# Patient Record
Sex: Male | Born: 1965
Health system: Southern US, Community
[De-identification: ages and names within clinical notes are randomized; demographics above are authoritative.]

## PROBLEM LIST (undated history)

## (undated) DIAGNOSIS — F39 Unspecified mood [affective] disorder: Secondary | ICD-10-CM

## (undated) DIAGNOSIS — Z9889 Other specified postprocedural states: Secondary | ICD-10-CM

## (undated) DIAGNOSIS — K219 Gastro-esophageal reflux disease without esophagitis: Secondary | ICD-10-CM

## (undated) DIAGNOSIS — I739 Peripheral vascular disease, unspecified: Secondary | ICD-10-CM

## (undated) DIAGNOSIS — E0590 Thyrotoxicosis, unspecified without thyrotoxic crisis or storm: Secondary | ICD-10-CM

## (undated) DIAGNOSIS — K922 Gastrointestinal hemorrhage, unspecified: Secondary | ICD-10-CM

## (undated) DIAGNOSIS — N529 Male erectile dysfunction, unspecified: Secondary | ICD-10-CM

## (undated) DIAGNOSIS — I700 Atherosclerosis of aorta: Secondary | ICD-10-CM

## (undated) DIAGNOSIS — I10 Essential (primary) hypertension: Secondary | ICD-10-CM

## (undated) DIAGNOSIS — Z8673 Personal history of transient ischemic attack (TIA), and cerebral infarction without residual deficits: Secondary | ICD-10-CM

## (undated) DIAGNOSIS — Z8719 Personal history of other diseases of the digestive system: Secondary | ICD-10-CM

## (undated) DIAGNOSIS — K5090 Crohn's disease, unspecified, without complications: Secondary | ICD-10-CM

## (undated) DIAGNOSIS — E559 Vitamin D deficiency, unspecified: Secondary | ICD-10-CM

## (undated) DIAGNOSIS — J449 Chronic obstructive pulmonary disease, unspecified: Secondary | ICD-10-CM

## (undated) HISTORY — DX: Peripheral vascular disease, unspecified: I73.9

## (undated) HISTORY — DX: Personal history of other diseases of the digestive system: Z87.19

## (undated) HISTORY — PX: WISDOM TOOTH EXTRACTION: SHX21

## (undated) HISTORY — DX: Crohn's disease, unspecified, without complications: K50.90

## (undated) HISTORY — DX: Other specified postprocedural states: Z98.890

## (undated) HISTORY — DX: Gastrointestinal hemorrhage, unspecified: K92.2

## (undated) HISTORY — PX: HERNIA REPAIR: SHX51

## (undated) HISTORY — DX: Vitamin D deficiency, unspecified: E55.9

## (undated) HISTORY — DX: Atherosclerosis of aorta: I70.0

## (undated) HISTORY — DX: Unspecified mood (affective) disorder: F39

## (undated) HISTORY — DX: Male erectile dysfunction, unspecified: N52.9

## (undated) HISTORY — PX: COLOSTOMY REVERSAL: SHX5782

## (undated) HISTORY — DX: Chronic obstructive pulmonary disease, unspecified: J44.9

## (undated) HISTORY — PX: OTHER SURGICAL HISTORY: SHX169

## (undated) HISTORY — PX: COLECTOMY: SHX59

## (undated) HISTORY — DX: Personal history of transient ischemic attack (TIA), and cerebral infarction without residual deficits: Z86.73

## (undated) HISTORY — DX: Gastro-esophageal reflux disease without esophagitis: K21.9

## (undated) HISTORY — DX: Essential (primary) hypertension: I10

## (undated) HISTORY — PX: APPENDECTOMY: SHX54

## (undated) HISTORY — DX: Thyrotoxicosis, unspecified without thyrotoxic crisis or storm: E05.90

---

## 2009-08-02 HISTORY — PX: UPPER GI ENDOSCOPY: SHX6162

## 2015-09-05 DIAGNOSIS — G459 Transient cerebral ischemic attack, unspecified: Secondary | ICD-10-CM | POA: Insufficient documentation

## 2015-09-05 DIAGNOSIS — E0590 Thyrotoxicosis, unspecified without thyrotoxic crisis or storm: Secondary | ICD-10-CM | POA: Insufficient documentation

## 2015-09-05 HISTORY — DX: Transient cerebral ischemic attack, unspecified: G45.9

## 2015-11-10 DIAGNOSIS — Z1389 Encounter for screening for other disorder: Secondary | ICD-10-CM | POA: Diagnosis not present

## 2015-11-10 DIAGNOSIS — E782 Mixed hyperlipidemia: Secondary | ICD-10-CM | POA: Diagnosis not present

## 2015-11-10 DIAGNOSIS — I10 Essential (primary) hypertension: Secondary | ICD-10-CM | POA: Diagnosis not present

## 2015-11-10 DIAGNOSIS — E559 Vitamin D deficiency, unspecified: Secondary | ICD-10-CM | POA: Diagnosis not present

## 2015-11-10 DIAGNOSIS — Z79899 Other long term (current) drug therapy: Secondary | ICD-10-CM | POA: Diagnosis not present

## 2015-11-10 DIAGNOSIS — K219 Gastro-esophageal reflux disease without esophagitis: Secondary | ICD-10-CM | POA: Diagnosis not present

## 2015-12-04 DIAGNOSIS — E0500 Thyrotoxicosis with diffuse goiter without thyrotoxic crisis or storm: Secondary | ICD-10-CM | POA: Diagnosis not present

## 2015-12-04 DIAGNOSIS — Z79899 Other long term (current) drug therapy: Secondary | ICD-10-CM | POA: Diagnosis not present

## 2015-12-23 DIAGNOSIS — Z01818 Encounter for other preprocedural examination: Secondary | ICD-10-CM | POA: Diagnosis not present

## 2015-12-23 DIAGNOSIS — Z1211 Encounter for screening for malignant neoplasm of colon: Secondary | ICD-10-CM | POA: Diagnosis not present

## 2015-12-23 DIAGNOSIS — K50911 Crohn's disease, unspecified, with rectal bleeding: Secondary | ICD-10-CM | POA: Diagnosis not present

## 2016-01-08 DIAGNOSIS — F17200 Nicotine dependence, unspecified, uncomplicated: Secondary | ICD-10-CM | POA: Diagnosis not present

## 2016-01-08 DIAGNOSIS — I10 Essential (primary) hypertension: Secondary | ICD-10-CM | POA: Diagnosis not present

## 2016-01-08 DIAGNOSIS — T7840XA Allergy, unspecified, initial encounter: Secondary | ICD-10-CM | POA: Diagnosis not present

## 2016-01-08 DIAGNOSIS — E7800 Pure hypercholesterolemia, unspecified: Secondary | ICD-10-CM | POA: Diagnosis not present

## 2016-01-12 DIAGNOSIS — K5090 Crohn's disease, unspecified, without complications: Secondary | ICD-10-CM | POA: Diagnosis not present

## 2016-01-13 DIAGNOSIS — K5090 Crohn's disease, unspecified, without complications: Secondary | ICD-10-CM | POA: Diagnosis not present

## 2016-01-31 HISTORY — PX: COLONOSCOPY: SHX174

## 2016-02-09 DIAGNOSIS — K219 Gastro-esophageal reflux disease without esophagitis: Secondary | ICD-10-CM | POA: Diagnosis not present

## 2016-02-09 DIAGNOSIS — K635 Polyp of colon: Secondary | ICD-10-CM | POA: Diagnosis not present

## 2016-02-09 DIAGNOSIS — Z1211 Encounter for screening for malignant neoplasm of colon: Secondary | ICD-10-CM | POA: Diagnosis not present

## 2016-02-09 DIAGNOSIS — I10 Essential (primary) hypertension: Secondary | ICD-10-CM | POA: Diagnosis not present

## 2016-02-09 DIAGNOSIS — F17200 Nicotine dependence, unspecified, uncomplicated: Secondary | ICD-10-CM | POA: Diagnosis not present

## 2016-02-09 DIAGNOSIS — E039 Hypothyroidism, unspecified: Secondary | ICD-10-CM | POA: Diagnosis not present

## 2016-02-09 DIAGNOSIS — K50911 Crohn's disease, unspecified, with rectal bleeding: Secondary | ICD-10-CM | POA: Diagnosis not present

## 2016-02-09 DIAGNOSIS — Z8673 Personal history of transient ischemic attack (TIA), and cerebral infarction without residual deficits: Secondary | ICD-10-CM | POA: Diagnosis not present

## 2016-02-09 DIAGNOSIS — M1990 Unspecified osteoarthritis, unspecified site: Secondary | ICD-10-CM | POA: Diagnosis not present

## 2016-02-09 DIAGNOSIS — E119 Type 2 diabetes mellitus without complications: Secondary | ICD-10-CM | POA: Diagnosis not present

## 2016-02-09 DIAGNOSIS — K5730 Diverticulosis of large intestine without perforation or abscess without bleeding: Secondary | ICD-10-CM | POA: Diagnosis not present

## 2016-02-09 DIAGNOSIS — J449 Chronic obstructive pulmonary disease, unspecified: Secondary | ICD-10-CM | POA: Diagnosis not present

## 2016-02-09 DIAGNOSIS — K5090 Crohn's disease, unspecified, without complications: Secondary | ICD-10-CM | POA: Diagnosis not present

## 2016-02-09 DIAGNOSIS — D124 Benign neoplasm of descending colon: Secondary | ICD-10-CM | POA: Diagnosis not present

## 2016-02-09 DIAGNOSIS — K648 Other hemorrhoids: Secondary | ICD-10-CM | POA: Diagnosis not present

## 2016-03-03 ENCOUNTER — Encounter: Payer: Self-pay | Admitting: Gastroenterology

## 2016-05-04 DIAGNOSIS — Z6831 Body mass index (BMI) 31.0-31.9, adult: Secondary | ICD-10-CM | POA: Diagnosis not present

## 2016-05-04 DIAGNOSIS — Z0000 Encounter for general adult medical examination without abnormal findings: Secondary | ICD-10-CM | POA: Diagnosis not present

## 2016-05-04 DIAGNOSIS — E669 Obesity, unspecified: Secondary | ICD-10-CM | POA: Diagnosis not present

## 2016-05-04 DIAGNOSIS — I10 Essential (primary) hypertension: Secondary | ICD-10-CM | POA: Diagnosis not present

## 2016-06-09 DIAGNOSIS — E0500 Thyrotoxicosis with diffuse goiter without thyrotoxic crisis or storm: Secondary | ICD-10-CM | POA: Diagnosis not present

## 2016-06-09 DIAGNOSIS — Z79899 Other long term (current) drug therapy: Secondary | ICD-10-CM | POA: Diagnosis not present

## 2016-07-05 DIAGNOSIS — F329 Major depressive disorder, single episode, unspecified: Secondary | ICD-10-CM | POA: Diagnosis not present

## 2016-07-05 DIAGNOSIS — M7989 Other specified soft tissue disorders: Secondary | ICD-10-CM | POA: Diagnosis not present

## 2016-07-05 DIAGNOSIS — E079 Disorder of thyroid, unspecified: Secondary | ICD-10-CM | POA: Diagnosis not present

## 2016-07-05 DIAGNOSIS — R609 Edema, unspecified: Secondary | ICD-10-CM | POA: Diagnosis not present

## 2016-07-05 DIAGNOSIS — K219 Gastro-esophageal reflux disease without esophagitis: Secondary | ICD-10-CM | POA: Diagnosis not present

## 2016-07-05 DIAGNOSIS — F419 Anxiety disorder, unspecified: Secondary | ICD-10-CM | POA: Diagnosis not present

## 2016-07-05 DIAGNOSIS — M25561 Pain in right knee: Secondary | ICD-10-CM | POA: Diagnosis not present

## 2016-07-05 DIAGNOSIS — F17210 Nicotine dependence, cigarettes, uncomplicated: Secondary | ICD-10-CM | POA: Diagnosis not present

## 2016-07-05 DIAGNOSIS — E785 Hyperlipidemia, unspecified: Secondary | ICD-10-CM | POA: Diagnosis not present

## 2016-07-05 DIAGNOSIS — M1711 Unilateral primary osteoarthritis, right knee: Secondary | ICD-10-CM | POA: Diagnosis not present

## 2016-07-05 DIAGNOSIS — I10 Essential (primary) hypertension: Secondary | ICD-10-CM | POA: Diagnosis not present

## 2016-07-05 DIAGNOSIS — L03115 Cellulitis of right lower limb: Secondary | ICD-10-CM | POA: Diagnosis not present

## 2016-07-12 DIAGNOSIS — L03115 Cellulitis of right lower limb: Secondary | ICD-10-CM | POA: Insufficient documentation

## 2016-07-12 DIAGNOSIS — Z6832 Body mass index (BMI) 32.0-32.9, adult: Secondary | ICD-10-CM | POA: Diagnosis not present

## 2016-07-12 HISTORY — DX: Cellulitis of right lower limb: L03.115

## 2016-07-13 DIAGNOSIS — L03115 Cellulitis of right lower limb: Secondary | ICD-10-CM | POA: Diagnosis not present

## 2016-07-14 DIAGNOSIS — L03115 Cellulitis of right lower limb: Secondary | ICD-10-CM | POA: Diagnosis not present

## 2016-07-15 DIAGNOSIS — L03115 Cellulitis of right lower limb: Secondary | ICD-10-CM | POA: Diagnosis not present

## 2016-07-15 DIAGNOSIS — M7041 Prepatellar bursitis, right knee: Secondary | ICD-10-CM | POA: Diagnosis not present

## 2016-07-16 DIAGNOSIS — L03115 Cellulitis of right lower limb: Secondary | ICD-10-CM | POA: Diagnosis not present

## 2016-07-22 DIAGNOSIS — M7041 Prepatellar bursitis, right knee: Secondary | ICD-10-CM | POA: Diagnosis not present

## 2016-08-10 DIAGNOSIS — M7041 Prepatellar bursitis, right knee: Secondary | ICD-10-CM | POA: Diagnosis not present

## 2016-08-23 DIAGNOSIS — Z6833 Body mass index (BMI) 33.0-33.9, adult: Secondary | ICD-10-CM | POA: Diagnosis not present

## 2016-08-23 DIAGNOSIS — M545 Low back pain: Secondary | ICD-10-CM | POA: Diagnosis not present

## 2016-08-23 DIAGNOSIS — L0291 Cutaneous abscess, unspecified: Secondary | ICD-10-CM | POA: Diagnosis not present

## 2016-08-23 DIAGNOSIS — E669 Obesity, unspecified: Secondary | ICD-10-CM | POA: Diagnosis not present

## 2016-08-25 DIAGNOSIS — M7041 Prepatellar bursitis, right knee: Secondary | ICD-10-CM | POA: Diagnosis not present

## 2016-09-08 DIAGNOSIS — M7041 Prepatellar bursitis, right knee: Secondary | ICD-10-CM | POA: Diagnosis not present

## 2016-09-09 DIAGNOSIS — I10 Essential (primary) hypertension: Secondary | ICD-10-CM | POA: Diagnosis not present

## 2016-09-09 DIAGNOSIS — E0590 Thyrotoxicosis, unspecified without thyrotoxic crisis or storm: Secondary | ICD-10-CM | POA: Diagnosis not present

## 2016-09-09 DIAGNOSIS — R600 Localized edema: Secondary | ICD-10-CM | POA: Diagnosis not present

## 2016-09-09 DIAGNOSIS — E782 Mixed hyperlipidemia: Secondary | ICD-10-CM | POA: Diagnosis not present

## 2016-09-17 DIAGNOSIS — Z79899 Other long term (current) drug therapy: Secondary | ICD-10-CM | POA: Diagnosis not present

## 2016-09-17 DIAGNOSIS — K219 Gastro-esophageal reflux disease without esophagitis: Secondary | ICD-10-CM | POA: Diagnosis not present

## 2016-09-17 DIAGNOSIS — M7041 Prepatellar bursitis, right knee: Secondary | ICD-10-CM | POA: Diagnosis not present

## 2016-09-17 DIAGNOSIS — I10 Essential (primary) hypertension: Secondary | ICD-10-CM | POA: Diagnosis not present

## 2016-09-17 DIAGNOSIS — E785 Hyperlipidemia, unspecified: Secondary | ICD-10-CM | POA: Diagnosis not present

## 2016-09-17 DIAGNOSIS — E039 Hypothyroidism, unspecified: Secondary | ICD-10-CM | POA: Diagnosis not present

## 2016-09-17 DIAGNOSIS — Z7982 Long term (current) use of aspirin: Secondary | ICD-10-CM | POA: Diagnosis not present

## 2016-09-17 DIAGNOSIS — I252 Old myocardial infarction: Secondary | ICD-10-CM | POA: Diagnosis not present

## 2016-09-17 DIAGNOSIS — F17210 Nicotine dependence, cigarettes, uncomplicated: Secondary | ICD-10-CM | POA: Diagnosis not present

## 2016-10-04 DIAGNOSIS — M6281 Muscle weakness (generalized): Secondary | ICD-10-CM | POA: Diagnosis not present

## 2016-10-04 DIAGNOSIS — M25461 Effusion, right knee: Secondary | ICD-10-CM | POA: Diagnosis not present

## 2016-10-04 DIAGNOSIS — R2689 Other abnormalities of gait and mobility: Secondary | ICD-10-CM | POA: Diagnosis not present

## 2016-10-04 DIAGNOSIS — M25561 Pain in right knee: Secondary | ICD-10-CM | POA: Diagnosis not present

## 2016-10-07 DIAGNOSIS — M25461 Effusion, right knee: Secondary | ICD-10-CM | POA: Diagnosis not present

## 2016-10-07 DIAGNOSIS — M6281 Muscle weakness (generalized): Secondary | ICD-10-CM | POA: Diagnosis not present

## 2016-10-07 DIAGNOSIS — M25561 Pain in right knee: Secondary | ICD-10-CM | POA: Diagnosis not present

## 2016-10-07 DIAGNOSIS — R2689 Other abnormalities of gait and mobility: Secondary | ICD-10-CM | POA: Diagnosis not present

## 2016-10-12 DIAGNOSIS — M25561 Pain in right knee: Secondary | ICD-10-CM | POA: Diagnosis not present

## 2016-10-12 DIAGNOSIS — M25461 Effusion, right knee: Secondary | ICD-10-CM | POA: Diagnosis not present

## 2016-10-12 DIAGNOSIS — R2689 Other abnormalities of gait and mobility: Secondary | ICD-10-CM | POA: Diagnosis not present

## 2016-10-12 DIAGNOSIS — M6281 Muscle weakness (generalized): Secondary | ICD-10-CM | POA: Diagnosis not present

## 2016-10-14 DIAGNOSIS — M6281 Muscle weakness (generalized): Secondary | ICD-10-CM | POA: Diagnosis not present

## 2016-10-14 DIAGNOSIS — M25461 Effusion, right knee: Secondary | ICD-10-CM | POA: Diagnosis not present

## 2016-10-14 DIAGNOSIS — M25561 Pain in right knee: Secondary | ICD-10-CM | POA: Diagnosis not present

## 2016-10-14 DIAGNOSIS — R2689 Other abnormalities of gait and mobility: Secondary | ICD-10-CM | POA: Diagnosis not present

## 2016-10-19 DIAGNOSIS — M6281 Muscle weakness (generalized): Secondary | ICD-10-CM | POA: Diagnosis not present

## 2016-10-19 DIAGNOSIS — R2689 Other abnormalities of gait and mobility: Secondary | ICD-10-CM | POA: Diagnosis not present

## 2016-10-19 DIAGNOSIS — M25461 Effusion, right knee: Secondary | ICD-10-CM | POA: Diagnosis not present

## 2016-10-19 DIAGNOSIS — M25561 Pain in right knee: Secondary | ICD-10-CM | POA: Diagnosis not present

## 2016-10-21 DIAGNOSIS — R2689 Other abnormalities of gait and mobility: Secondary | ICD-10-CM | POA: Diagnosis not present

## 2016-10-21 DIAGNOSIS — M25461 Effusion, right knee: Secondary | ICD-10-CM | POA: Diagnosis not present

## 2016-10-21 DIAGNOSIS — M6281 Muscle weakness (generalized): Secondary | ICD-10-CM | POA: Diagnosis not present

## 2016-10-21 DIAGNOSIS — M25561 Pain in right knee: Secondary | ICD-10-CM | POA: Diagnosis not present

## 2016-10-25 DIAGNOSIS — M7041 Prepatellar bursitis, right knee: Secondary | ICD-10-CM | POA: Diagnosis not present

## 2016-12-02 DIAGNOSIS — R1012 Left upper quadrant pain: Secondary | ICD-10-CM | POA: Diagnosis not present

## 2016-12-02 DIAGNOSIS — Z6833 Body mass index (BMI) 33.0-33.9, adult: Secondary | ICD-10-CM | POA: Diagnosis not present

## 2016-12-02 DIAGNOSIS — E669 Obesity, unspecified: Secondary | ICD-10-CM | POA: Diagnosis not present

## 2016-12-03 DIAGNOSIS — R1012 Left upper quadrant pain: Secondary | ICD-10-CM | POA: Diagnosis not present

## 2016-12-03 DIAGNOSIS — I700 Atherosclerosis of aorta: Secondary | ICD-10-CM | POA: Diagnosis not present

## 2016-12-03 DIAGNOSIS — R109 Unspecified abdominal pain: Secondary | ICD-10-CM | POA: Diagnosis not present

## 2016-12-07 DIAGNOSIS — Z79899 Other long term (current) drug therapy: Secondary | ICD-10-CM | POA: Diagnosis not present

## 2016-12-07 DIAGNOSIS — E0500 Thyrotoxicosis with diffuse goiter without thyrotoxic crisis or storm: Secondary | ICD-10-CM | POA: Diagnosis not present

## 2016-12-09 DIAGNOSIS — Z9889 Other specified postprocedural states: Secondary | ICD-10-CM | POA: Diagnosis not present

## 2016-12-09 DIAGNOSIS — Z8719 Personal history of other diseases of the digestive system: Secondary | ICD-10-CM | POA: Diagnosis not present

## 2016-12-16 DIAGNOSIS — Z8719 Personal history of other diseases of the digestive system: Secondary | ICD-10-CM

## 2016-12-16 DIAGNOSIS — Z9889 Other specified postprocedural states: Secondary | ICD-10-CM | POA: Diagnosis not present

## 2016-12-16 HISTORY — DX: Personal history of other diseases of the digestive system: Z87.19

## 2016-12-24 DIAGNOSIS — Z9119 Patient's noncompliance with other medical treatment and regimen: Secondary | ICD-10-CM | POA: Diagnosis not present

## 2016-12-24 DIAGNOSIS — I10 Essential (primary) hypertension: Secondary | ICD-10-CM

## 2016-12-24 DIAGNOSIS — K660 Peritoneal adhesions (postprocedural) (postinfection): Secondary | ICD-10-CM | POA: Diagnosis not present

## 2016-12-24 DIAGNOSIS — F17210 Nicotine dependence, cigarettes, uncomplicated: Secondary | ICD-10-CM | POA: Diagnosis not present

## 2016-12-24 DIAGNOSIS — Z7982 Long term (current) use of aspirin: Secondary | ICD-10-CM | POA: Diagnosis not present

## 2016-12-24 DIAGNOSIS — Z8673 Personal history of transient ischemic attack (TIA), and cerebral infarction without residual deficits: Secondary | ICD-10-CM | POA: Diagnosis not present

## 2016-12-24 DIAGNOSIS — E785 Hyperlipidemia, unspecified: Secondary | ICD-10-CM | POA: Insufficient documentation

## 2016-12-24 DIAGNOSIS — K432 Incisional hernia without obstruction or gangrene: Secondary | ICD-10-CM | POA: Diagnosis not present

## 2016-12-24 DIAGNOSIS — G4733 Obstructive sleep apnea (adult) (pediatric): Secondary | ICD-10-CM | POA: Diagnosis not present

## 2016-12-24 DIAGNOSIS — E0590 Thyrotoxicosis, unspecified without thyrotoxic crisis or storm: Secondary | ICD-10-CM | POA: Diagnosis not present

## 2016-12-24 DIAGNOSIS — Z9889 Other specified postprocedural states: Secondary | ICD-10-CM | POA: Diagnosis not present

## 2016-12-24 DIAGNOSIS — K219 Gastro-esophageal reflux disease without esophagitis: Secondary | ICD-10-CM | POA: Insufficient documentation

## 2016-12-24 HISTORY — DX: Hyperlipidemia, unspecified: E78.5

## 2016-12-24 HISTORY — DX: Essential (primary) hypertension: I10

## 2016-12-31 DIAGNOSIS — K432 Incisional hernia without obstruction or gangrene: Secondary | ICD-10-CM

## 2016-12-31 DIAGNOSIS — Z9119 Patient's noncompliance with other medical treatment and regimen: Secondary | ICD-10-CM | POA: Diagnosis not present

## 2016-12-31 DIAGNOSIS — E0590 Thyrotoxicosis, unspecified without thyrotoxic crisis or storm: Secondary | ICD-10-CM | POA: Diagnosis not present

## 2016-12-31 DIAGNOSIS — I10 Essential (primary) hypertension: Secondary | ICD-10-CM | POA: Diagnosis not present

## 2016-12-31 DIAGNOSIS — G4733 Obstructive sleep apnea (adult) (pediatric): Secondary | ICD-10-CM | POA: Diagnosis not present

## 2016-12-31 DIAGNOSIS — K660 Peritoneal adhesions (postprocedural) (postinfection): Secondary | ICD-10-CM | POA: Diagnosis not present

## 2016-12-31 DIAGNOSIS — Z9889 Other specified postprocedural states: Secondary | ICD-10-CM | POA: Diagnosis not present

## 2016-12-31 DIAGNOSIS — K430 Incisional hernia with obstruction, without gangrene: Secondary | ICD-10-CM | POA: Diagnosis not present

## 2016-12-31 DIAGNOSIS — Z8673 Personal history of transient ischemic attack (TIA), and cerebral infarction without residual deficits: Secondary | ICD-10-CM | POA: Diagnosis not present

## 2016-12-31 DIAGNOSIS — Z7982 Long term (current) use of aspirin: Secondary | ICD-10-CM | POA: Diagnosis not present

## 2016-12-31 DIAGNOSIS — F17210 Nicotine dependence, cigarettes, uncomplicated: Secondary | ICD-10-CM | POA: Diagnosis not present

## 2016-12-31 HISTORY — DX: Incisional hernia without obstruction or gangrene: K43.2

## 2016-12-31 HISTORY — DX: Other specified postprocedural states: Z98.890

## 2017-01-01 DIAGNOSIS — Z9889 Other specified postprocedural states: Secondary | ICD-10-CM | POA: Diagnosis not present

## 2017-01-01 DIAGNOSIS — G4733 Obstructive sleep apnea (adult) (pediatric): Secondary | ICD-10-CM | POA: Diagnosis not present

## 2017-01-01 DIAGNOSIS — Z8673 Personal history of transient ischemic attack (TIA), and cerebral infarction without residual deficits: Secondary | ICD-10-CM | POA: Diagnosis not present

## 2017-01-01 DIAGNOSIS — K660 Peritoneal adhesions (postprocedural) (postinfection): Secondary | ICD-10-CM | POA: Diagnosis not present

## 2017-01-01 DIAGNOSIS — I10 Essential (primary) hypertension: Secondary | ICD-10-CM | POA: Diagnosis not present

## 2017-01-01 DIAGNOSIS — F17210 Nicotine dependence, cigarettes, uncomplicated: Secondary | ICD-10-CM | POA: Diagnosis not present

## 2017-01-01 DIAGNOSIS — E0590 Thyrotoxicosis, unspecified without thyrotoxic crisis or storm: Secondary | ICD-10-CM | POA: Diagnosis not present

## 2017-01-01 DIAGNOSIS — K432 Incisional hernia without obstruction or gangrene: Secondary | ICD-10-CM | POA: Diagnosis not present

## 2017-01-01 DIAGNOSIS — Z7982 Long term (current) use of aspirin: Secondary | ICD-10-CM | POA: Diagnosis not present

## 2017-01-01 DIAGNOSIS — Z9119 Patient's noncompliance with other medical treatment and regimen: Secondary | ICD-10-CM | POA: Diagnosis not present

## 2017-01-11 DIAGNOSIS — R300 Dysuria: Secondary | ICD-10-CM | POA: Diagnosis not present

## 2017-01-11 DIAGNOSIS — K219 Gastro-esophageal reflux disease without esophagitis: Secondary | ICD-10-CM | POA: Diagnosis not present

## 2017-01-11 DIAGNOSIS — E559 Vitamin D deficiency, unspecified: Secondary | ICD-10-CM | POA: Diagnosis not present

## 2017-01-11 DIAGNOSIS — Z79899 Other long term (current) drug therapy: Secondary | ICD-10-CM | POA: Diagnosis not present

## 2017-01-11 DIAGNOSIS — Z1389 Encounter for screening for other disorder: Secondary | ICD-10-CM | POA: Diagnosis not present

## 2017-01-11 DIAGNOSIS — I10 Essential (primary) hypertension: Secondary | ICD-10-CM | POA: Diagnosis not present

## 2017-01-11 DIAGNOSIS — E782 Mixed hyperlipidemia: Secondary | ICD-10-CM | POA: Diagnosis not present

## 2017-02-28 DIAGNOSIS — Z6832 Body mass index (BMI) 32.0-32.9, adult: Secondary | ICD-10-CM | POA: Diagnosis not present

## 2017-02-28 DIAGNOSIS — R600 Localized edema: Secondary | ICD-10-CM | POA: Diagnosis not present

## 2017-02-28 DIAGNOSIS — E669 Obesity, unspecified: Secondary | ICD-10-CM | POA: Diagnosis not present

## 2017-02-28 DIAGNOSIS — I10 Essential (primary) hypertension: Secondary | ICD-10-CM | POA: Diagnosis not present

## 2017-03-04 DIAGNOSIS — R188 Other ascites: Secondary | ICD-10-CM | POA: Diagnosis not present

## 2017-03-08 DIAGNOSIS — K91873 Postprocedural seroma of a digestive system organ or structure following other procedure: Secondary | ICD-10-CM | POA: Diagnosis not present

## 2017-03-08 DIAGNOSIS — T888XXA Other specified complications of surgical and medical care, not elsewhere classified, initial encounter: Secondary | ICD-10-CM | POA: Diagnosis not present

## 2017-03-08 DIAGNOSIS — T792XXA Traumatic secondary and recurrent hemorrhage and seroma, initial encounter: Secondary | ICD-10-CM | POA: Diagnosis not present

## 2017-03-08 DIAGNOSIS — R188 Other ascites: Secondary | ICD-10-CM | POA: Diagnosis not present

## 2017-03-09 DIAGNOSIS — Z0000 Encounter for general adult medical examination without abnormal findings: Secondary | ICD-10-CM | POA: Diagnosis not present

## 2017-03-09 DIAGNOSIS — E669 Obesity, unspecified: Secondary | ICD-10-CM | POA: Diagnosis not present

## 2017-03-09 DIAGNOSIS — Z1389 Encounter for screening for other disorder: Secondary | ICD-10-CM | POA: Diagnosis not present

## 2017-03-09 DIAGNOSIS — E785 Hyperlipidemia, unspecified: Secondary | ICD-10-CM | POA: Diagnosis not present

## 2017-05-05 DIAGNOSIS — Z23 Encounter for immunization: Secondary | ICD-10-CM | POA: Diagnosis not present

## 2017-05-05 DIAGNOSIS — E782 Mixed hyperlipidemia: Secondary | ICD-10-CM | POA: Diagnosis not present

## 2017-05-05 DIAGNOSIS — E559 Vitamin D deficiency, unspecified: Secondary | ICD-10-CM | POA: Diagnosis not present

## 2017-05-05 DIAGNOSIS — K219 Gastro-esophageal reflux disease without esophagitis: Secondary | ICD-10-CM | POA: Diagnosis not present

## 2017-05-05 DIAGNOSIS — I10 Essential (primary) hypertension: Secondary | ICD-10-CM | POA: Diagnosis not present

## 2017-06-22 DIAGNOSIS — Z79899 Other long term (current) drug therapy: Secondary | ICD-10-CM | POA: Diagnosis not present

## 2017-06-22 DIAGNOSIS — E0500 Thyrotoxicosis with diffuse goiter without thyrotoxic crisis or storm: Secondary | ICD-10-CM | POA: Diagnosis not present

## 2017-08-09 DIAGNOSIS — I10 Essential (primary) hypertension: Secondary | ICD-10-CM | POA: Diagnosis not present

## 2017-08-09 DIAGNOSIS — Z6833 Body mass index (BMI) 33.0-33.9, adult: Secondary | ICD-10-CM | POA: Diagnosis not present

## 2017-08-09 DIAGNOSIS — E669 Obesity, unspecified: Secondary | ICD-10-CM | POA: Diagnosis not present

## 2017-08-09 DIAGNOSIS — M7581 Other shoulder lesions, right shoulder: Secondary | ICD-10-CM | POA: Diagnosis not present

## 2017-09-08 DIAGNOSIS — J029 Acute pharyngitis, unspecified: Secondary | ICD-10-CM | POA: Diagnosis not present

## 2017-09-08 DIAGNOSIS — Z79899 Other long term (current) drug therapy: Secondary | ICD-10-CM | POA: Diagnosis not present

## 2017-09-08 DIAGNOSIS — I10 Essential (primary) hypertension: Secondary | ICD-10-CM | POA: Diagnosis not present

## 2017-09-08 DIAGNOSIS — R509 Fever, unspecified: Secondary | ICD-10-CM | POA: Diagnosis not present

## 2017-09-08 DIAGNOSIS — E559 Vitamin D deficiency, unspecified: Secondary | ICD-10-CM | POA: Diagnosis not present

## 2017-09-08 DIAGNOSIS — Z6833 Body mass index (BMI) 33.0-33.9, adult: Secondary | ICD-10-CM | POA: Diagnosis not present

## 2017-09-08 DIAGNOSIS — E782 Mixed hyperlipidemia: Secondary | ICD-10-CM | POA: Diagnosis not present

## 2017-12-01 ENCOUNTER — Telehealth: Payer: Self-pay | Admitting: Gastroenterology

## 2017-12-01 ENCOUNTER — Encounter: Payer: Self-pay | Admitting: Gastroenterology

## 2017-12-01 ENCOUNTER — Telehealth: Payer: Self-pay

## 2017-12-01 DIAGNOSIS — Z6833 Body mass index (BMI) 33.0-33.9, adult: Secondary | ICD-10-CM | POA: Diagnosis not present

## 2017-12-01 DIAGNOSIS — E669 Obesity, unspecified: Secondary | ICD-10-CM | POA: Diagnosis not present

## 2017-12-01 DIAGNOSIS — F39 Unspecified mood [affective] disorder: Secondary | ICD-10-CM | POA: Diagnosis not present

## 2017-12-01 DIAGNOSIS — R1084 Generalized abdominal pain: Secondary | ICD-10-CM | POA: Diagnosis not present

## 2017-12-01 DIAGNOSIS — R600 Localized edema: Secondary | ICD-10-CM | POA: Diagnosis not present

## 2017-12-01 DIAGNOSIS — R1033 Periumbilical pain: Secondary | ICD-10-CM | POA: Diagnosis not present

## 2017-12-01 NOTE — Telephone Encounter (Signed)
Urgent referral from Dr. Mathis Bud at [facility] for patient to be seen for abdominal pain.  Putting him on Dr Urban Gibson schedule tomorrow at 3:00 pm.  Had a CT today.

## 2017-12-01 NOTE — Telephone Encounter (Signed)
lmtcb 5/2

## 2017-12-02 ENCOUNTER — Ambulatory Visit (INDEPENDENT_AMBULATORY_CARE_PROVIDER_SITE_OTHER): Payer: BLUE CROSS/BLUE SHIELD | Admitting: Gastroenterology

## 2017-12-02 ENCOUNTER — Encounter: Payer: Self-pay | Admitting: Gastroenterology

## 2017-12-02 VITALS — BP 126/74 | HR 68 | Ht 73.0 in | Wt 246.0 lb

## 2017-12-02 DIAGNOSIS — R1084 Generalized abdominal pain: Secondary | ICD-10-CM | POA: Diagnosis not present

## 2017-12-02 MED ORDER — DICYCLOMINE HCL 10 MG PO CAPS: 10.0000 mg | ORAL_CAPSULE | Freq: Three times a day (TID) | ORAL | 6 refills | Status: DC

## 2017-12-02 NOTE — Progress Notes (Signed)
Chief Complaint: Abdominal pain  Referring Provider:  Dr Mathis Bud      ASSESSMENT AND PLAN;   #1. Acute on chronic abdominal pain - likely abdominal wall pain, negative CT 12/01/2017 yesterday for any small bowel obstruction. Neg colon 2017 (No records available). Bently 10mg  po qid #120  Heating pads. Reviewed CT with patient. Call in 2 weeks. Please obtain previous records from [facility]  #2. Large vantral hernia s/p repair complicated by PBSO requiring laparoscopy Jan 2018 at [facility]  - Please obtain previous records. - Await record deconversion. - FU in 12 weeks, earlier in case of any problems.   HPI:   52 year old seen as an emergency work in. We do not have any records from [facility]. Patient had " severe" abdominal pain and was seen by Dr. Mathis Bud yesterday.  He underwent emergent CT scan of the abdomen and pelvis with p.o. and IV contrast, yesterday at 12/01/2017 which did not show any acute abnormalities. We have been able to get the report of the CT scan but not the films as it was done in [facility].  Patient feels better.  Has been having chronic abdominal pain, generalized with some abdominal bloating which is better than yesterday.  He denies having any nausea, vomiting, heartburn, regurgitation, odynophagia or dysphagia.  He has history of diarrhea especially after eating salads at the frequency of 2-3 bowel movements per day without any nocturnal symptoms.  He had undergone EGD and colonoscopy.  Records are not available at this time. At one point in time he was told he has Crohn's disease.  However, the CT scan done yesterday does not show any evidence of inflammatory bowel disease or any abscesses.  No small bowel obstruction.   Past Medical History:  Diagnosis Date      . GI bleed   . Hypertension   . Hyperthyroidism   . Status post laparoscopy 12/2016   removed mesh from previous hernia repair that was causing a strangulated small intestine.    Past  Surgical History:  Procedure Laterality Date  . COLONOSCOPY  01/2016  . UPPER GI ENDOSCOPY  2011    History reviewed. No pertinent family history.  Social History   Tobacco Use  . Smoking status: Current Every Day Smoker    Packs/day: 1.50  . Smokeless tobacco: Never Used  Substance Use Topics  . Alcohol use: Not Currently  . Drug use: Never    Current Outpatient Medications  Medication Sig Dispense Refill  . amLODipine (NORVASC) 5 MG tablet Take 5 mg by mouth daily.    Marland Kitchen aspirin EC 81 MG tablet Take 81 mg by mouth daily.    Marland Kitchen atorvastatin (LIPITOR) 20 MG tablet Take 20 mg by mouth daily.    Marland Kitchen buPROPion (WELLBUTRIN XL) 150 MG 24 hr tablet Take 150 mg by mouth daily.    . cholecalciferol (VITAMIN D) 1000 units tablet Take 1,000 Units by mouth daily.    Marland Kitchen dipyridamole-aspirin (AGGRENOX) 200-25 MG 12hr capsule Take 1 capsule by mouth daily.    Marland Kitchen docusate sodium (COLACE) 100 MG capsule Take 100 mg by mouth 2 (two) times daily as needed for mild constipation.    . furosemide (LASIX) 20 MG tablet Take 20 mg by mouth.    . hyoscyamine (LEVSIN SL) 0.125 MG SL tablet Place 0.125 mg under the tongue every 4 (four) hours as needed.    . methimazole (TAPAZOLE) 5 MG tablet Take 5 mg by mouth daily.    Marland Kitchen  montelukast (SINGULAIR) 10 MG tablet Take 10 mg by mouth at bedtime.    . naloxegol oxalate (MOVANTIK) 25 MG TABS tablet Take by mouth as needed.    Marland Kitchen omeprazole (PRILOSEC) 20 MG capsule Take 20 mg by mouth daily.    . Vitamin D, Ergocalciferol, (DRISDOL) 50000 units CAPS capsule Take 50,000 Units by mouth every 7 (seven) days.     No current facility-administered medications for this visit.     Not on File  Review of Systems:  Constitutional: Denies fever, chills, diaphoresis, appetite change and fatigue.  HEENT: Denies photophobia, eye pain, redness, hearing loss, ear pain, congestion, sore throat, rhinorrhea, sneezing, mouth sores, neck pain, neck stiffness and tinnitus.     Respiratory: Denies SOB, DOE, cough, chest tightness,  and wheezing.   Cardiovascular: Denies chest pain, palpitations and leg swelling.  Genitourinary: Denies dysuria, urgency, frequency, hematuria, flank pain and difficulty urinating.  Musculoskeletal: Denies myalgias, back pain, joint swelling, arthralgias and gait problem.  Skin: No rash.  Neurological: Denies dizziness, seizures, syncope, weakness, light-headedness, numbness and headaches.  Hematological: Denies adenopathy. Easy bruising, personal or family bleeding history  Psychiatric/Behavioral: No anxiety or depression     Physical Exam:    BP 126/74   Pulse 68   Ht 6\' 1"  (1.854 m)   Wt 246 lb (111.6 kg)   BMI 32.46 kg/m  Filed Weights   12/02/17 1525  Weight: 246 lb (111.6 kg)   Constitutional:  Well-developed, in no acute distress. Psychiatric: Normal mood and affect. Behavior is normal. HEENT: Pupils normal.  Conjunctivae are normal. No scleral icterus. Neck supple.  Cardiovascular: Normal rate, regular rhythm. No edema Pulmonary/chest: Effort normal and breath sounds normal. No wheezing, rales or rhonchi. Abdominal: Soft, nondistended. Nontender. Bowel sounds active throughout. There are no masses palpable. No hepatomegaly.  Multiple well-healed scars  Rectal:  defered Neurological: Alert and oriented to person place and time. Skin: Skin is warm and dry. No rashes noted.  Data Reviewed:   CT scan of the abdomen and pelvis 12/01/2017 with p.o. and IV contrast-no acute abnormality, no small bowel obstruction, no abscesses, aortic atherosclerosis.   Edman Circle, MD   Cc: Dr Mathis Bud

## 2017-12-02 NOTE — Patient Instructions (Addendum)
If you are age 54 or older, your body mass index should be between 23-30. Your Body mass index is 32.46 kg/m. If this is out of the aforementioned range listed, please consider follow up with your Primary Care Provider.  If you are age 31 or younger, your body mass index should be between 19-25. Your Body mass index is 32.46 kg/m. If this is out of the aformentioned range listed, please consider follow up with your Primary Care Provider.   The patient is advised to apply heat intermittently (avoid sleeping on heating pad).  Call in two weeks and let Dr. Urban Gibson nurse Christen Bame, RN know how you are doing.  We have sent the following medications to your pharmacy for you to pick up at your convenience: Bentyl 10mg  take one tablet by mouth 4 times daily.  Thank you for choosing [facility], Edman Circle, MD

## 2018-01-02 DIAGNOSIS — Z6833 Body mass index (BMI) 33.0-33.9, adult: Secondary | ICD-10-CM | POA: Diagnosis not present

## 2018-01-02 DIAGNOSIS — I10 Essential (primary) hypertension: Secondary | ICD-10-CM | POA: Diagnosis not present

## 2018-01-02 DIAGNOSIS — R5383 Other fatigue: Secondary | ICD-10-CM | POA: Diagnosis not present

## 2018-01-02 DIAGNOSIS — R1010 Upper abdominal pain, unspecified: Secondary | ICD-10-CM | POA: Diagnosis not present

## 2018-01-02 DIAGNOSIS — E669 Obesity, unspecified: Secondary | ICD-10-CM | POA: Diagnosis not present

## 2018-01-03 ENCOUNTER — Telehealth: Payer: Self-pay | Admitting: Gastroenterology

## 2018-01-03 MED ORDER — PB-HYOSCY-ATROPINE-SCOPOLAMINE 16.2 MG PO TABS: 1.0000 | ORAL_TABLET | ORAL | 0 refills | Status: DC | PRN

## 2018-01-03 MED ORDER — PB-HYOSCY-ATROPINE-SCOPOLAMINE 16.2 MG PO TABS: 1.0000 | ORAL_TABLET | Freq: Three times a day (TID) | ORAL | 0 refills | Status: DC

## 2018-01-03 NOTE — Telephone Encounter (Signed)
Patient notified

## 2018-01-03 NOTE — Telephone Encounter (Signed)
Let us try Donnatal 1 tab po tid prn (60) Sedation precautions and driving precautions

## 2018-01-03 NOTE — Telephone Encounter (Signed)
Patient was prescribed bentyl for abdominal pain and reports it is no longer working. Please advise

## 2018-01-05 DIAGNOSIS — Z79899 Other long term (current) drug therapy: Secondary | ICD-10-CM | POA: Diagnosis not present

## 2018-01-05 DIAGNOSIS — E0500 Thyrotoxicosis with diffuse goiter without thyrotoxic crisis or storm: Secondary | ICD-10-CM | POA: Diagnosis not present

## 2018-04-10 DIAGNOSIS — I10 Essential (primary) hypertension: Secondary | ICD-10-CM | POA: Diagnosis not present

## 2018-04-10 DIAGNOSIS — Z1331 Encounter for screening for depression: Secondary | ICD-10-CM | POA: Diagnosis not present

## 2018-04-10 DIAGNOSIS — Z1339 Encounter for screening examination for other mental health and behavioral disorders: Secondary | ICD-10-CM | POA: Diagnosis not present

## 2018-04-10 DIAGNOSIS — E559 Vitamin D deficiency, unspecified: Secondary | ICD-10-CM | POA: Diagnosis not present

## 2018-04-10 DIAGNOSIS — Z6833 Body mass index (BMI) 33.0-33.9, adult: Secondary | ICD-10-CM | POA: Diagnosis not present

## 2018-04-10 DIAGNOSIS — E669 Obesity, unspecified: Secondary | ICD-10-CM | POA: Diagnosis not present

## 2018-04-27 DIAGNOSIS — L02416 Cutaneous abscess of left lower limb: Secondary | ICD-10-CM | POA: Diagnosis not present

## 2018-04-27 DIAGNOSIS — D485 Neoplasm of uncertain behavior of skin: Secondary | ICD-10-CM | POA: Diagnosis not present

## 2018-05-01 DIAGNOSIS — Z6832 Body mass index (BMI) 32.0-32.9, adult: Secondary | ICD-10-CM | POA: Diagnosis not present

## 2018-05-01 DIAGNOSIS — Z23 Encounter for immunization: Secondary | ICD-10-CM | POA: Diagnosis not present

## 2018-05-01 DIAGNOSIS — Z0000 Encounter for general adult medical examination without abnormal findings: Secondary | ICD-10-CM | POA: Diagnosis not present

## 2018-09-05 DIAGNOSIS — E782 Mixed hyperlipidemia: Secondary | ICD-10-CM | POA: Diagnosis not present

## 2018-09-05 DIAGNOSIS — Z79899 Other long term (current) drug therapy: Secondary | ICD-10-CM | POA: Diagnosis not present

## 2018-09-05 DIAGNOSIS — I10 Essential (primary) hypertension: Secondary | ICD-10-CM | POA: Diagnosis not present

## 2018-09-05 DIAGNOSIS — Z6832 Body mass index (BMI) 32.0-32.9, adult: Secondary | ICD-10-CM | POA: Diagnosis not present

## 2018-09-05 DIAGNOSIS — R600 Localized edema: Secondary | ICD-10-CM | POA: Diagnosis not present

## 2018-10-11 DIAGNOSIS — E0500 Thyrotoxicosis with diffuse goiter without thyrotoxic crisis or storm: Secondary | ICD-10-CM | POA: Diagnosis not present

## 2018-10-11 DIAGNOSIS — Z79899 Other long term (current) drug therapy: Secondary | ICD-10-CM | POA: Diagnosis not present

## 2018-12-29 DIAGNOSIS — Z20828 Contact with and (suspected) exposure to other viral communicable diseases: Secondary | ICD-10-CM | POA: Diagnosis not present

## 2019-01-08 DIAGNOSIS — Z6832 Body mass index (BMI) 32.0-32.9, adult: Secondary | ICD-10-CM | POA: Diagnosis not present

## 2019-01-08 DIAGNOSIS — R600 Localized edema: Secondary | ICD-10-CM | POA: Diagnosis not present

## 2019-01-08 DIAGNOSIS — Z79899 Other long term (current) drug therapy: Secondary | ICD-10-CM | POA: Diagnosis not present

## 2019-01-08 DIAGNOSIS — I10 Essential (primary) hypertension: Secondary | ICD-10-CM | POA: Diagnosis not present

## 2019-01-09 ENCOUNTER — Telehealth: Payer: Self-pay | Admitting: Cardiology

## 2019-01-09 NOTE — Telephone Encounter (Signed)
Virtual Visit Pre-Appointment Phone Call  "(Name), I am calling you today to discuss your upcoming appointment. We are currently trying to limit exposure to the virus that causes COVID-19 by seeing patients at home rather than in the office."  1. "What is the BEST phone number to call the day of the visit?" - include this in appointment notes  2. Do you have or have access to (through a family member/friend) a smartphone with video capability that we can use for your visit?" a. If yes - list this number in appt notes as cell (if different from BEST phone #) and list the appointment type as a VIDEO visit in appointment notes b. If no - list the appointment type as a PHONE visit in appointment notes  3. Confirm consent - "In the setting of the current Covid19 crisis, you are scheduled for a (phone or video) visit with your provider on (date) at (time).  Just as we do with many in-office visits, in order for you to participate in this visit, we must obtain consent.  If you'd like, I can send this to your mychart (if signed up) or email for you to review.  Otherwise, I can obtain your verbal consent now.  All virtual visits are billed to your insurance company just like a normal visit would be.  By agreeing to a virtual visit, we'd like you to understand that the technology does not allow for your provider to perform an examination, and thus may limit your provider's ability to fully assess your condition. If your provider identifies any concerns that need to be evaluated in person, we will make arrangements to do so.  Finally, though the technology is pretty good, we cannot assure that it will always work on either your or our end, and in the setting of a video visit, we may have to convert it to a phone-only visit.  In either situation, we cannot ensure that we have a secure connection.  Are you willing to proceed?" STAFF: Did the patient verbally acknowledge consent to telehealth visit? Document  YES/NO here:YES  4. Advise patient to be prepared - "Two hours prior to your appointment, go ahead and check your blood pressure, pulse, oxygen saturation, and your weight (if you have the equipment to check those) and write them all down. When your visit starts, your provider will ask you for this information. If you have an Apple Watch or Kardia device, please plan to have heart rate information ready on the day of your appointment. Please have a pen and paper handy nearby the day of the visit as well."  5. Give patient instructions for MyChart download to smartphone OR Doximity/Doxy.me as below if video visit (depending on what platform provider is using)  6. Inform patient they will receive a phone call 15 minutes prior to their appointment time (may be from unknown caller ID) so they should be prepared to answer    TELEPHONE CALL NOTE  Calvin Wilkerson has been deemed a candidate for a follow-up tele-health visit to limit community exposure during the Covid-19 pandemic. I spoke with the patient via phone to ensure availability of phone/video source, confirm preferred email & phone number, and discuss instructions and expectations.  I reminded Calvin Wilkerson to be prepared with any vital sign and/or heart rhythm information that could potentially be obtained via home monitoring, at the time of his visit. I reminded Calvin Wilkerson to expect a phone call prior to his visit.  Lattie Haw  Marcelyn Ditty 01/09/2019 2:22 PM   INSTRUCTIONS FOR DOWNLOADING THE MYCHART APP TO SMARTPHONE  - The patient must first make sure to have activated MyChart and know their login information - If Apple, go to [employer] and type in MyChart in the search bar and download the app. If Android, ask patient to go to [employer] and type in [place] in the search bar and download the app. The app is free but as with any other app downloads, their phone may require them to verify saved payment information or Apple/Android password.  -  The patient will need to then log into the app with their MyChart username and password, and select [facility] as their healthcare provider to link the account. When it is time for your visit, go to the MyChart app, find appointments, and click Begin Video Visit. Be sure to Select Allow for your device to access the Microphone and Camera for your visit. You will then be connected, and your provider will be with you shortly.  **If they have any issues connecting, or need assistance please contact MyChart service desk [phone])**  **If using a computer, in order to ensure the best quality for their visit they will need to use either of the following Internet Browsers: [employer] or Google Chrome**  IF USING DOXIMITY or DOXY.ME - The patient will receive a link just prior to their visit by text.     FULL LENGTH CONSENT FOR TELE-HEALTH VISIT   I hereby voluntarily request, consent and authorize [facility] and its employed or contracted physicians, physician assistants, nurse practitioners or other licensed health care professionals (the Practitioner), to provide me with telemedicine health care services (the Services") as deemed necessary by the treating Practitioner. I acknowledge and consent to receive the Services by the Practitioner via telemedicine. I understand that the telemedicine visit will involve communicating with the Practitioner through live audiovisual communication technology and the disclosure of certain medical information by electronic transmission. I acknowledge that I have been given the opportunity to request an in-person assessment or other available alternative prior to the telemedicine visit and am voluntarily participating in the telemedicine visit.  I understand that I have the right to withhold or withdraw my consent to the use of telemedicine in the course of my care at any time, without affecting my right to future care or treatment, and that the  Practitioner or I may terminate the telemedicine visit at any time. I understand that I have the right to inspect all information obtained and/or recorded in the course of the telemedicine visit and may receive copies of available information for a reasonable fee.  I understand that some of the potential risks of receiving the Services via telemedicine include:   Delay or interruption in medical evaluation due to technological equipment failure or disruption;  Information transmitted may not be sufficient (e.g. poor resolution of images) to allow for appropriate medical decision making by the Practitioner; and/or   In rare instances, security protocols could fail, causing a breach of personal health information.  Furthermore, I acknowledge that it is my responsibility to provide information about my medical history, conditions and care that is complete and accurate to the best of my ability. I acknowledge that Practitioner's advice, recommendations, and/or decision may be based on factors not within their control, such as incomplete or inaccurate data provided by me or distortions of diagnostic images or specimens that may result from electronic transmissions. I understand that the practice  of medicine is not an Chief Strategy Officer and that Practitioner makes no warranties or guarantees regarding treatment outcomes. I acknowledge that I will receive a copy of this consent concurrently upon execution via email to the email address I last provided but may also request a printed copy by calling the office of [facility].    I understand that my insurance will be billed for this visit.   I have read or had this consent read to me.  I understand the contents of this consent, which adequately explains the benefits and risks of the Services being provided via telemedicine.   I have been provided ample opportunity to ask questions regarding this consent and the Services and have had my questions answered to my  satisfaction.  I give my informed consent for the services to be provided through the use of telemedicine in my medical care  By participating in this telemedicine visit I agree to the above.

## 2019-01-15 ENCOUNTER — Telehealth (INDEPENDENT_AMBULATORY_CARE_PROVIDER_SITE_OTHER): Payer: BC Managed Care – PPO | Admitting: Cardiology

## 2019-01-15 ENCOUNTER — Encounter: Payer: Self-pay | Admitting: Cardiology

## 2019-01-15 ENCOUNTER — Other Ambulatory Visit: Payer: Self-pay

## 2019-01-15 VITALS — BP 142/75 | HR 66 | Ht 73.0 in | Wt 255.0 lb

## 2019-01-15 DIAGNOSIS — F17210 Nicotine dependence, cigarettes, uncomplicated: Secondary | ICD-10-CM

## 2019-01-15 DIAGNOSIS — R0609 Other forms of dyspnea: Secondary | ICD-10-CM | POA: Diagnosis not present

## 2019-01-15 DIAGNOSIS — R0600 Dyspnea, unspecified: Secondary | ICD-10-CM

## 2019-01-15 DIAGNOSIS — E782 Mixed hyperlipidemia: Secondary | ICD-10-CM

## 2019-01-15 DIAGNOSIS — I10 Essential (primary) hypertension: Secondary | ICD-10-CM

## 2019-01-15 HISTORY — DX: Nicotine dependence, cigarettes, uncomplicated: F17.210

## 2019-01-15 HISTORY — DX: Other forms of dyspnea: R06.09

## 2019-01-15 HISTORY — DX: Dyspnea, unspecified: R06.00

## 2019-01-15 MED ORDER — NITROGLYCERIN 0.4 MG SL SUBL: 0.4000 mg | SUBLINGUAL_TABLET | SUBLINGUAL | 3 refills | Status: DC | PRN

## 2019-01-15 NOTE — Addendum Note (Signed)
Addended by: Beckey Rutter on: 01/15/2019 11:38 AM   Modules accepted: Orders

## 2019-01-15 NOTE — Patient Instructions (Addendum)
Medication Instructions:  Your physician has recommended you make the following change in your medication:  START taking nitroglycerin as needed for chest pain, When having chest pain, stop what you are doing and sit down. Take 1 nitro, wait 5 minutes. Still having chest pain, take 1 nitro, wait 5 minutes. Still having chest pain, take 1 nitro, dial 911. Total of 3 nitro in 15 minutes.   If you need a refill on your cardiac medications before your next appointment, please call your pharmacy.   Lab work: NONE If you have labs (blood work) drawn today and your tests are completely normal, you will receive your results only by: Marland Kitchen MyChart Message (if you have MyChart) OR . A paper copy in the mail If you have any lab test that is abnormal or we need to change your treatment, we will call you to review the results.  Testing/Procedures: Your physician has requested that you have an echocardiogram.[place] ON February 22, 2019 at 09:15 AM. Echocardiography is a painless test that uses sound waves to create images of your heart. It provides your doctor with information about the size and shape of your heart and how well your heart's chambers and valves are working. This procedure takes approximately one hour. There are no restrictions for this procedure.  Your physician has requested that you have a lexiscan myoview. For further information please visit [email or web address]. Please follow instruction sheet, as given.     [facility] [address] [place], [place] [address] Phone:  [phone]  January 15, 2019    Calvin Wilkerson DOB: Feb 13, 1966 MRN: [id number] [address] [place] [address]   Dear Calvin Wilkerson,  You are scheduled for a Myocardial Perfusion Imaging Study on:  February 14, 2019 at 11:00 AM.  Please arrive 15 minutes prior to your appointment time for registration and insurance purposes.  The test will take  approximately 3 to 4 hours to complete; you may bring reading material.  If someone comes with you to your appointment, they will need to remain in the main lobby due to limited space in the testing area. **If you are pregnant or breastfeeding, please notify the nuclear lab prior to your appointment**  How to prepare for your Myocardial Perfusion Test: . Do not eat or drink 3 hours prior to your test, except you may have water. . Do not consume products containing caffeine (regular or decaffeinated) 12 hours prior to your test. (ex: coffee, chocolate, sodas, tea). Do bring a list of your current medications with you.  If not listed below, you may take your medications as normal. . Do not take losartan-HCTZ or furosemide the morning prior to the test.  Bring the medication to your appointment as you may be required to take it once the test is complete. . Do wear comfortable clothes (no dresses or overalls) and walking shoes, tennis shoes preferred (No heels or open toe shoes are allowed). . Do NOT wear cologne, perfume, aftershave, or lotions (deodorant is allowed). . If these instructions are not followed, your test will have to be rescheduled.  Please report to [address] for your test.  If you have questions or concerns about your appointment, you can call the [facility] at [phone].  If you cannot keep your appointment, please provide 24 hours notification to the Nuclear Lab, to avoid a possible $50 charge to your account.   Follow-Up:  At [facility], you and your health needs are our priority.  As part of our continuing mission to provide you with exceptional heart care, we have created designated Provider Care Teams.  These Care Teams include your primary Cardiologist (physician) and Advanced Practice Providers (APPs -  Physician Assistants and Nurse Practitioners) who all work together to provide you with the care you need, when you need it.    Follow up in 1 month with Dr. Tomie China  Any Other Special Instructions Will Be Listed Below   Echocardiogram An echocardiogram is a procedure that uses painless sound waves (ultrasound) to produce an image of the heart. Images from an echocardiogram can provide important information about:  Signs of coronary artery disease (CAD).  Aneurysm detection. An aneurysm is a weak or damaged part of an artery wall that bulges out from the normal force of blood pumping through the body.  Heart size and shape. Changes in the size or shape of the heart can be associated with certain conditions, including heart failure, aneurysm, and CAD.  Heart muscle function.  Heart valve function.  Signs of a past heart attack.  Fluid buildup around the heart.  Thickening of the heart muscle.  A tumor or infectious growth around the heart valves. Tell a health care provider about:  Any allergies you have.  All medicines you are taking, including vitamins, herbs, eye drops, creams, and over-the-counter medicines.  Any blood disorders you have.  Any surgeries you have had.  Any medical conditions you have.  Whether you are pregnant or may be pregnant. What are the risks? Generally, this is a safe procedure. However, problems may occur, including:  Allergic reaction to dye (contrast) that may be used during the procedure. What happens before the procedure? No specific preparation is needed. You may eat and drink normally. What happens during the procedure?   An IV tube may be inserted into one of your veins.  You may receive contrast through this tube. A contrast is an injection that improves the quality of the pictures from your heart.  A gel will be applied to your chest.  A wand-like tool (transducer) will be moved over your chest. The gel will help to transmit the sound waves from the transducer.  The sound waves will harmlessly bounce off of your heart to allow the heart images to  be captured in real-time motion. The images will be recorded on a computer. The procedure may vary among health care providers and hospitals. What happens after the procedure?  You may return to your normal, everyday life, including diet, activities, and medicines, unless your health care provider tells you not to do that. Summary  An echocardiogram is a procedure that uses painless sound waves (ultrasound) to produce an image of the heart.  Images from an echocardiogram can provide important information about the size and shape of your heart, heart muscle function, heart valve function, and fluid buildup around your heart.  You do not need to do anything to prepare before this procedure. You may eat and drink normally.  After the echocardiogram is completed, you may return to your normal, everyday life, unless your health care provider tells you not to do that. This information is not intended to replace advice given to you by your health care provider. Make sure you discuss any questions you have with your health care provider. Document Released: 07/16/2000 Document Revised: 08/21/2016 Document Reviewed: 08/21/2016 Elsevier Interactive Patient Education  2019 [employer].  Regadenoson injection  What is this medicine? REGADENOSON is used to test the heart for coronary artery disease. It is used in patients who can not exercise for their stress test. This medicine may be used for other purposes; ask your health care provider or pharmacist if you have questions. COMMON BRAND NAME(S): Lexiscan What should I tell my health care provider before I take this medicine? They need to know if you have any of these conditions: -heart problems -lung or breathing disease, like asthma or COPD -an unusual or allergic reaction to regadenoson, other medicines, foods, dyes, or preservatives -pregnant or trying to get pregnant -breast-feeding How should I use this medicine? This medicine is for  injection into a vein. It is given by a health care professional in a hospital or clinic setting. Talk to your pediatrician regarding the use of this medicine in children. Special care may be needed. Overdosage: If you think you have taken too much of this medicine contact a poison control center or emergency room at once. NOTE: This medicine is only for you. Do not share this medicine with others. What if I miss a dose? This does not apply. What may interact with this medicine? -caffeine -dipyridamole -guarana -theophylline This list may not describe all possible interactions. Give your health care provider a list of all the medicines, herbs, non-prescription drugs, or dietary supplements you use. Also tell them if you smoke, drink alcohol, or use illegal drugs. Some items may interact with your medicine. What should I watch for while using this medicine? Your condition will be monitored carefully while you are receiving this medicine. Do not take medicines, foods, or drinks with caffeine (like coffee, tea, or colas) for at least 12 hours before your test. If you do not know if something contains caffeine, ask your health care professional. What side effects may I notice from receiving this medicine? Side effects that you should report to your doctor or health care professional as soon as possible: -allergic reactions like skin rash, itching or hives, swelling of the face, lips, or tongue -breathing problems -chest pain, tightness or palpitations -severe headache Side effects that usually do not require medical attention (report to your doctor or health care professional if they continue or are bothersome): -flushing -headache -irritation or pain at site where injected -nausea, vomiting This list may not describe all possible side effects. Call your doctor for medical advice about side effects. You may report side effects to FDA at 1-800-FDA-1088. Where should I keep my medicine? This  drug is given in a hospital or clinic and will not be stored at home. NOTE: This sheet is a summary. It may not cover all possible information. If you have questions about this medicine, talk to your doctor, pharmacist, or health care provider.  2019 Elsevier/Gold Standard (2008-03-18 15:08:13)   Cardiac Nuclear Scan A cardiac nuclear scan is a test that is done to check the flow of blood to your heart. It is done when you are resting and when you are exercising. The test looks for problems such as:  Not enough blood reaching a portion of the heart.  The heart muscle not working as it should. You may need this test if:  You have heart disease.  You have had lab results that are not normal.  You have had heart surgery or a balloon procedure to open up blocked arteries (angioplasty).  You have chest pain.  You have shortness of breath. In this test, a special dye (tracer) is put into  your bloodstream. The tracer will travel to your heart. A camera will then take pictures of your heart to see how the tracer moves through your heart. This test is usually done at a hospital and takes 2-4 hours. Tell a doctor about:  Any allergies you have.  All medicines you are taking, including vitamins, herbs, eye drops, creams, and over-the-counter medicines.  Any problems you or family members have had with anesthetic medicines.  Any blood disorders you have.  Any surgeries you have had.  Any medical conditions you have.  Whether you are pregnant or may be pregnant. What are the risks? Generally, this is a safe test. However, problems may occur, such as:  Serious chest pain and heart attack. This is only a risk if the stress portion of the test is done.  Rapid heartbeat.  A feeling of warmth in your chest. This feeling usually does not last long.  Allergic reaction to the tracer. What happens before the test?  Ask your doctor about changing or stopping your normal medicines. This is  important.  Follow instructions from your doctor about what you cannot eat or drink.  Remove your jewelry on the day of the test. What happens during the test?  An IV tube will be inserted into one of your veins.  Your doctor will give you a small amount of tracer through the IV tube.  You will wait for 20-40 minutes while the tracer moves through your bloodstream.  Your heart will be monitored with an electrocardiogram (ECG).  You will lie down on an exam table.  Pictures of your heart will be taken for about 15-20 minutes.  You may also have a stress test. For this test, one of these things may be done: ? You will be asked to exercise on a treadmill or a stationary bike. ? You will be given medicines that will make your heart work harder. This is done if you are unable to exercise.  When blood flow to your heart has peaked, a tracer will again be given through the IV tube.  After 20-40 minutes, you will get back on the exam table. More pictures will be taken of your heart.  Depending on the tracer that is used, more pictures may need to be taken 3-4 hours later.  Your IV tube will be removed when the test is over. The test may vary among doctors and hospitals. What happens after the test?  Ask your doctor: ? Whether you can return to your normal schedule, including diet, activities, and medicines. ? Whether you should drink more fluids. This will help to remove the tracer from your body. Drink enough fluid to keep your pee (urine) pale yellow.  Ask your doctor, or the department that is doing the test: ? When will my results be ready? ? How will I get my results? Summary  A cardiac nuclear scan is a test that is done to check the flow of blood to your heart.  Tell your doctor whether you are pregnant or may be pregnant.  Before the test, ask your doctor about changing or stopping your normal medicines. This is important.  Ask your doctor whether you can return to  your normal activities. You may be asked to drink more fluids. This information is not intended to replace advice given to you by your health care provider. Make sure you discuss any questions you have with your health care provider. Document Released: 01/02/2018 Document Revised: 01/02/2018 Document Reviewed: 01/02/2018 Elsevier  Interactive Patient Education  [employer].

## 2019-01-15 NOTE — Progress Notes (Signed)
Virtual Visit via Video Note   This visit type was conducted due to national recommendations for restrictions regarding the COVID-19 Pandemic (e.g. social distancing) in an effort to limit this patient's exposure and mitigate transmission in our community.  Due to his co-morbid illnesses, this patient is at least at moderate risk for complications without adequate follow up.  This format is felt to be most appropriate for this patient at this time.  All issues noted in this document were discussed and addressed.  A limited physical exam was performed with this format.  Please refer to the patient's chart for his consent to telehealth [facility].   Date:  01/15/2019   ID:  Calvin Fiscaloger Soots, DOB [birth year], MRN [id number]  Patient Location: Home Provider Location: Home  PCP:  Lucianne LeiUppin, Nina, MD  Cardiologist: None Electrophysiologist:  None   Evaluation Performed:  Consultation - Calvin Wilkerson was referred by Dr Mathis BudUppin for the evaluation of DOE.  Chief Complaint: Dyspnea on exertion  History of Present Illness:    Calvin FiscalRoger Montag is a 53 y.o. male with past medical history of essential hypertension, dyslipidemia, obesity, COPD and history of chronic smoking.  The patient is referred for shortness of breath on exertion.  The symptoms are chronic in nature.  Patient denies any chest pain or chest tightness.  His shortness of breath is worse.  He is chest CT evaluation has revealed revealed aortic atherosclerosis.  He is on statin therapy.  He leads a sedentary lifestyle and is overweight and does not exercise on a regular basis.  At the time of my evaluation, the patient is alert awake oriented and in no distress.  The patient does not have symptoms concerning for COVID-19 infection (fever, chills, cough, or new shortness of breath).    Past Medical History:  Diagnosis Date  . Crohn's disease (HCC)   . GI bleed   . Hypertension   . Hyperthyroidism   . Status post laparoscopy 12/2016   removed mesh from previous hernia repair that was causing a strangulated small intestine.   Past Surgical History:  Procedure Laterality Date  . COLONOSCOPY  01/2016  . UPPER GI ENDOSCOPY  2011     Current Meds  Medication Sig  . acetaminophen (TYLENOL) 500 MG tablet Take 4 tablets by mouth daily.  Marland Kitchen. aspirin EC 81 MG tablet Take 81 mg by mouth daily.  Marland Kitchen. atorvastatin (LIPITOR) 20 MG tablet Take 20 mg by mouth daily.  Marland Kitchen. dipyridamole-aspirin (AGGRENOX) 200-25 MG 12hr capsule Take 1 capsule by mouth 2 (two) times daily.  Marland Kitchen. docusate sodium (COLACE) 100 MG capsule Take 100 mg by mouth 2 (two) times daily as needed for mild constipation.  . fluticasone (FLONASE) 50 MCG/ACT nasal spray 1 spray by Each Nare route daily.  . furosemide (LASIX) 20 MG tablet Take 20 mg by mouth 2 (two) times daily.   Marland Kitchen. losartan-hydrochlorothiazide (HYZAAR) 50-12.5 MG tablet Take 1 tablet by mouth daily.  . methimazole (TAPAZOLE) 5 MG tablet Take 5 mg by mouth daily.  . montelukast (SINGULAIR) 10 MG tablet Take 10 mg by mouth at bedtime.  Marland Kitchen. omeprazole (PRILOSEC) 20 MG capsule Take 20 mg by mouth daily.  . valACYclovir (VALTREX) 500 MG tablet Take 500 mg by mouth 2 (two) times daily.  . Vitamin D, Ergocalciferol, (DRISDOL) 50000 units CAPS capsule Take 50,000 Units by mouth every 7 (seven) days.  . [DISCONTINUED] amLODipine (NORVASC) 5 MG tablet Take 5 mg by mouth daily.     Allergies:  Patient has no known allergies.   Social History   Tobacco Use  . Smoking status: Current Every Day Smoker    Packs/day: 1.50  . Smokeless tobacco: Never Used  Substance Use Topics  . Alcohol use: Not Currently  . Drug use: Never     Family Hx: The patient's family history includes Bone cancer in his brother and maternal grandmother; Breast cancer in his sister; Hypertension in his mother; Hypothyroidism in his mother.  ROS:   Please see the history of present illness.    I reviewed records from primary care physician's  office All other systems reviewed and are negative.   Prior CV studies:   The following studies were reviewed today:  I reviewed records from primary care physician's office.  As mentioned above CT scan of the abdomen done on 12/01/2017 revealed aortic atherosclerosis  Labs/Other Tests and Data Reviewed:    EKG:  No ECG reviewed.  Recent Labs: No results found for requested labs within last 8760 hours.   Recent Lipid Panel No results found for: CHOL, TRIG, HDL, CHOLHDL, LDLCALC, LDLDIRECT  Wt Readings from Last 3 Encounters:  01/15/19 255 lb (115.7 kg)  12/02/17 246 lb (111.6 kg)     Objective:    Vital Signs:  BP (!) 142/75 (BP Location: Left Arm, Patient Position: Sitting, Cuff Size: Normal)   Pulse 66   Ht 6\' 1"  (1.854 m)   Wt 255 lb (115.7 kg)   BMI 33.64 kg/m    VITAL SIGNS:  reviewed  ASSESSMENT & PLAN:    1. Dyspnea on exertion: Secondary prevention stressed with the patient.  Importance of compliance with diet and medication stressed and he vocalized understanding.  To understand the symptoms he will have a Lexiscan sestamibi. 2. Essential hypertension: His blood pressure stable.  We will do echocardiogram to understand cardiac anatomy, endorgan damage from hypertension and right-sided cardiac evaluation in view of COPD. 3. The patient has multiple risk factors for coronary artery disease and atherosclerotic vascular disease.  Sublingual nitroglycerin prescription was sent, its protocol and 911 protocol explained and the patient vocalized understanding questions were answered to the patient's satisfaction 4. Cigarette smoking: I spent 5 minutes with the patient discussing solely about smoking. Smoking cessation was counseled. I suggested to the patient also different medications and pharmacological interventions. Patient is keen to try stopping on its own at this time. He will get back to me if he needs any further assistance in this matter. 5. Follow-up appointment in  a month or earlier if he has any concerns.  He knows to go to the nearest emergency room for any concerning symptoms.  Total time for this evaluation was 45 minutes and this included chart review.  COVID-19 Education: The signs and symptoms of COVID-19 were discussed with the patient and how to seek care for testing (follow up with PCP or arrange E-visit).  The importance of social distancing was discussed today.  Time:   Today, I have spent 32 minutes with the patient with telehealth technology discussing the above problems.     Medication Adjustments/Labs and Tests Ordered: Current medicines are reviewed at length with the patient today.  Concerns regarding medicines are outlined above.   Tests Ordered: No orders of the defined types were placed in this encounter.   Medication Changes: No orders of the defined types were placed in this encounter.   Follow Up: only virtual.. one  month  Signed, Jenean Lindau, MD  01/15/2019 8:51 AM  [facility]

## 2019-01-16 ENCOUNTER — Telehealth: Payer: Self-pay

## 2019-01-16 NOTE — Telephone Encounter (Signed)
A fax came from the pharmacy yesterday afternoon with a message stating taht this patient has an active RX for Tadalafil and to advise the patient if this needs to discontinued due to new Nitro script.

## 2019-01-16 NOTE — Telephone Encounter (Signed)
RN advised against taking nitro with tadalafil and warnings. Patient states he does not take Tadalafil but has a script for it. No further questions.

## 2019-02-07 ENCOUNTER — Telehealth (HOSPITAL_COMMUNITY): Payer: Self-pay | Admitting: *Deleted

## 2019-02-07 NOTE — Telephone Encounter (Signed)
Patient given detailed instructions per Myocardial Perfusion Study Information Sheet for the test on 02/14/19 at 11:00. Patient notified to arrive 15 minutes early and that it is imperative to arrive on time for appointment to keep from having the test rescheduled.  If you need to cancel or reschedule your appointment, please call the office within 24 hours of your appointment. . Patient verbalized understanding.Calvin Wilkerson

## 2019-02-12 DIAGNOSIS — R51 Headache: Secondary | ICD-10-CM | POA: Diagnosis not present

## 2019-02-12 DIAGNOSIS — I739 Peripheral vascular disease, unspecified: Secondary | ICD-10-CM | POA: Diagnosis not present

## 2019-02-12 DIAGNOSIS — R1010 Upper abdominal pain, unspecified: Secondary | ICD-10-CM | POA: Diagnosis not present

## 2019-02-12 DIAGNOSIS — M129 Arthropathy, unspecified: Secondary | ICD-10-CM | POA: Diagnosis not present

## 2019-02-14 ENCOUNTER — Other Ambulatory Visit: Payer: Self-pay

## 2019-02-14 ENCOUNTER — Ambulatory Visit (INDEPENDENT_AMBULATORY_CARE_PROVIDER_SITE_OTHER): Payer: BC Managed Care – PPO

## 2019-02-14 DIAGNOSIS — R0609 Other forms of dyspnea: Secondary | ICD-10-CM | POA: Diagnosis not present

## 2019-02-14 DIAGNOSIS — Z79899 Other long term (current) drug therapy: Secondary | ICD-10-CM | POA: Diagnosis not present

## 2019-02-14 DIAGNOSIS — E782 Mixed hyperlipidemia: Secondary | ICD-10-CM | POA: Diagnosis not present

## 2019-02-14 DIAGNOSIS — E559 Vitamin D deficiency, unspecified: Secondary | ICD-10-CM | POA: Diagnosis not present

## 2019-02-14 LAB — MYOCARDIAL PERFUSION IMAGING
LV dias vol: 106 mL (ref 62–150)
LV sys vol: 37 mL
Peak HR: 77 {beats}/min
Rest HR: 59 {beats}/min
SDS: 5
SRS: 3
SSS: 7
TID: 1.09

## 2019-02-14 MED ORDER — TECHNETIUM TC 99M TETROFOSMIN IV KIT
10.9000 | PACK | Freq: Once | INTRAVENOUS | Status: AC | PRN
  Administered 2019-02-14: 10.9 via INTRAVENOUS

## 2019-02-14 MED ORDER — REGADENOSON 0.4 MG/5ML IV SOLN
0.4000 mg | Freq: Once | INTRAVENOUS | Status: AC
  Administered 2019-02-14: 0.4 mg via INTRAVENOUS

## 2019-02-14 MED ORDER — TECHNETIUM TC 99M TETROFOSMIN IV KIT
32.7000 | PACK | Freq: Once | INTRAVENOUS | Status: AC | PRN
  Administered 2019-02-14: 32.7 via INTRAVENOUS

## 2019-02-19 ENCOUNTER — Other Ambulatory Visit: Payer: Self-pay

## 2019-02-19 ENCOUNTER — Telehealth (INDEPENDENT_AMBULATORY_CARE_PROVIDER_SITE_OTHER): Payer: BC Managed Care – PPO | Admitting: Cardiology

## 2019-02-19 VITALS — BP 157/91 | HR 91 | Ht 73.0 in | Wt 250.0 lb

## 2019-02-19 DIAGNOSIS — R9439 Abnormal result of other cardiovascular function study: Secondary | ICD-10-CM

## 2019-02-19 DIAGNOSIS — F17210 Nicotine dependence, cigarettes, uncomplicated: Secondary | ICD-10-CM | POA: Diagnosis not present

## 2019-02-19 DIAGNOSIS — E782 Mixed hyperlipidemia: Secondary | ICD-10-CM

## 2019-02-19 DIAGNOSIS — R0609 Other forms of dyspnea: Secondary | ICD-10-CM

## 2019-02-19 DIAGNOSIS — R0600 Dyspnea, unspecified: Secondary | ICD-10-CM

## 2019-02-19 DIAGNOSIS — I10 Essential (primary) hypertension: Secondary | ICD-10-CM

## 2019-02-19 HISTORY — DX: Abnormal result of other cardiovascular function study: R94.39

## 2019-02-19 NOTE — Progress Notes (Signed)
Virtual Visit via Video Note   This visit type was conducted due to national recommendations for restrictions regarding the COVID-19 Pandemic (e.g. social distancing) in an effort to limit this patient's exposure and mitigate transmission in our community.  Due to his co-morbid illnesses, this patient is at least at moderate risk for complications without adequate follow up.  This format is felt to be most appropriate for this patient at this time.  All issues noted in this document were discussed and addressed.  A limited physical exam was performed with this format.  Please refer to the patient's chart for his consent to telehealth for [facility].  Evaluation Performed:  Follow-up visit  This visit type was conducted due to national recommendations for restrictions regarding the COVID-19 Pandemic (e.g. social distancing).  This format is felt to be most appropriate for this patient at this time.  All issues noted in this document were discussed and addressed.  No physical exam was performed (except for noted visual exam findings with Video Visits).  Please refer to the patient's chart (MyChart message for video visits and phone note for telephone visits) for the patient's consent to telehealth for [facility].  Date:  02/19/2019  ID: Calvin Wilkerson, DOB 11/08/1965, MRN [id number]   Patient Location: [address] [place] [address]   Provider location:   [place] Office  PCP:  Nicholos Johns, MD  Cardiologist:  Jenne Campus, MD     Chief Complaint: I am still having chest pain  History of Present Illness:    Calvin Wilkerson is a 53 y.o. male  who presents via audio/video conferencing for a telehealth visit today.  With multiple risk factors for coronary artery disease which include hypertension, ongoing smoking, dyslipidemia, history of borderline diabetes.  Apparently 8 years ago he did have a cardiac catheterization which showed nonobstructive lesions.  He was referred  back to [place] because of recurrences of chest pain.  He described to have sharp pain in the left side of his chest which is not related to exercise.  While exercising his chief complaint is shortness of breath which is worse than before.  He smokes much less now only 1 pack/day.  Used to smoke more than that.  He did have a stress test in form of Lexiscan which showed ischemia involving inferior wall and he is here today to talk about options for this situation.  Since the time he got the stress test he seems to be doing well he is trying to avoid heavy exercises.   The patient does not have symptoms concerning for COVID-19 infection (fever, chills, cough, or new SHORTNESS OF BREATH).    Prior CV studies:   The following studies were reviewed today:  Stress test done on 02/14/2019 showed:  The left ventricular ejection fraction is normal (55-65%).  Nuclear stress EF: 65%.  There was no ST segment deviation noted during stress.  Defect 1: There is a medium defect of moderate severity present in the basal inferior and mid inferior location.  Findings consistent with ischemia.  This is an intermediate risk study.     Past Medical History:  Diagnosis Date  . Crohn's disease ([place])   . GI bleed   . Hypertension   . Hyperthyroidism   . Status post laparoscopy 12/2016   removed mesh from previous hernia repair that was causing a strangulated small intestine.    Past Surgical History:  Procedure Laterality Date  . COLONOSCOPY  01/2016  .  UPPER GI ENDOSCOPY  2011     Current Meds  Medication Sig  . acetaminophen (TYLENOL) 500 MG tablet Take 4 tablets by mouth daily.  Marland Kitchen aspirin EC 81 MG tablet Take 81 mg by mouth daily.  Marland Kitchen atorvastatin (LIPITOR) 20 MG tablet Take 20 mg by mouth daily.  Marland Kitchen dipyridamole-aspirin (AGGRENOX) 200-25 MG 12hr capsule Take 1 capsule by mouth daily.   Marland Kitchen docusate sodium (COLACE) 100 MG capsule Take 100 mg by mouth 2 (two) times daily as needed for mild  constipation.  . fluticasone (FLONASE) 50 MCG/ACT nasal spray 1 spray by Each Nare route daily.  . furosemide (LASIX) 20 MG tablet Take 20 mg by mouth 2 (two) times daily.   Marland Kitchen losartan-hydrochlorothiazide (HYZAAR) 50-12.5 MG tablet Take 1 tablet by mouth daily.  . methimazole (TAPAZOLE) 5 MG tablet Take 5 mg by mouth daily.  . montelukast (SINGULAIR) 10 MG tablet Take 10 mg by mouth at bedtime.  . nitroGLYCERIN (NITROSTAT) 0.4 MG SL tablet Place 1 tablet (0.4 mg total) under the tongue every 5 (five) minutes as needed.  Marland Kitchen omeprazole (PRILOSEC) 20 MG capsule Take 20 mg by mouth daily.  . valACYclovir (VALTREX) 500 MG tablet Take 500 mg by mouth 2 (two) times daily.  . Vitamin D, Ergocalciferol, (DRISDOL) 50000 units CAPS capsule Take 50,000 Units by mouth every 7 (seven) days.      Family History: The patient's family history includes Bone cancer in his brother and maternal grandmother; Breast cancer in his sister; Hypertension in his mother; Hypothyroidism in his mother.   ROS:   Please see the history of present illness.     All other systems reviewed and are negative.   Labs/Other Tests and Data Reviewed:     Recent Labs: No results found for requested labs within last 8760 hours.  Recent Lipid Panel No results found for: CHOL, TRIG, HDL, CHOLHDL, VLDL, LDLCALC, LDLDIRECT    Exam:    Vital Signs:  BP (!) 157/91   Pulse 91   Ht 6\' 1"  (1.854 m)   Wt 250 lb (113.4 kg)   BMI 32.98 kg/m     Wt Readings from Last 3 Encounters:  02/19/19 250 lb (113.4 kg)  02/14/19 255 lb (115.7 kg)  01/15/19 255 lb (115.7 kg)     Well nourished, well developed in no acute distress. I am talking to him of the video link he is asymptomatic not in any distress quite cheerful and happy to be able to talk to me.  Diagnosis for this visit:   1. Cigarette smoker   2. Abnormal stress test showing ischemia involving inferior wall   3. DOE (dyspnea on exertion)   4. HTN (hypertension),  benign   5. Mixed hyperlipidemia      ASSESSMENT & PLAN:    1.  Abnormal stress test we had a long discussion of this 20-minute talk about options in this situation.  We talked about medical therapy versus cardiac catheterization.  I told him that in my opinion the best option is to proceed with cardiac catheterization.  His symptoms are probably best described as atypical chest pain however with significant risk factors for coronary artery disease the best way to determine significance of his symptomatology and abnormal stress test would be to proceed with cardiac catheterization.  He is reluctant to make a decision he said that he would like to have some more time to think about it since he does not have any worsening of his symptoms  lately I think it is fine with this approach.  He does have nitroglycerin he knows how to use it.  We also talk about different options like to simply medical therapy.  He said he will let us know within 2 weeks which way he would like to proceed. 2.  Cigarette smoking we spent at least 5 minutes talking about need to quit he understand he will try to do it. 3.  Dyspnea on exertion multifactorial obviously anginal equivalent should be taken to the consideration however clearly significant role here cigarette smoking in place. 4.  Essential hypertension on appropriate medication which I will continue 5.  Dyslipidemia he is on Lipitor which I will continue.  COVID-19 Education: The signs and symptoms of COVID-19 were discussed with the patient and how to seek care for testing (follow up with PCP or arrange E-visit).  The importance of social distancing was discussed today.  Patient Risk:   After full review of this patients clinical status, I feel that they are at least moderate risk at this time.  Time:   Today, I have spent 25 minutes with the patient with telehealth technology discussing pt health issues.  I spent 5 minutes reviewing her chart before the visit.   Visit was finished at 3:51 PM.    Medication Adjustments/Labs and Tests Ordered: Current medicines are reviewed at length with the patient today.  Concerns regarding medicines are outlined above.  No orders of the defined types were placed in this encounter.  Medication changes: No orders of the defined types were placed in this encounter.    Disposition: Follow-up in 2 weeks with Dr. Tomie Chinaevankar  Signed, Georgeanna Leaobert J. , MD, [facility] 02/19/2019 3:52 PM    [facility]

## 2019-02-19 NOTE — Patient Instructions (Signed)
Medication Instructions:  Your physician recommends that you continue on your current medications as directed. Please refer to the Current Medication list given to you today.  If you need a refill on your cardiac medications before your next appointment, please call your pharmacy.   Lab work: None.  If you have labs (blood work) drawn today and your tests are completely normal, you will receive your results only by: Marland Kitchen MyChart Message (if you have MyChart) OR . A paper copy in the mail If you have any lab test that is abnormal or we need to change your treatment, we will call you to review the results.  Testing/Procedures: None.   Follow-Up: At [facility], you and your health needs are our priority.  As part of our continuing mission to provide you with exceptional heart care, we have created designated Provider Care Teams.  These Care Teams include your primary Cardiologist (physician) and Advanced Practice Providers (APPs -  Physician Assistants and Nurse Practitioners) who all work together to provide you with the care you need, when you need it. You will need a follow up appointment in 2 weeks.  Please call our office 2 months in advance to schedule this appointment.  You may see No primary care provider on file. or another member of our [employer] Provider Team in [place]: Jenne Campus, MD . Shirlee More, MD  Any Other Special Instructions Will Be Listed Below (If Applicable).

## 2019-02-22 ENCOUNTER — Encounter: Payer: Self-pay | Admitting: Gastroenterology

## 2019-02-22 ENCOUNTER — Other Ambulatory Visit: Payer: BC Managed Care – PPO

## 2019-02-26 ENCOUNTER — Encounter: Payer: Self-pay | Admitting: Gastroenterology

## 2019-02-26 ENCOUNTER — Telehealth: Payer: BC Managed Care – PPO | Admitting: Cardiology

## 2019-03-07 ENCOUNTER — Telehealth: Payer: Self-pay

## 2019-03-07 NOTE — Telephone Encounter (Addendum)
Covid-19 screening questions   Do you now or have you had a fever in the last 14 days? No  Do you have any respiratory symptoms of shortness of breath or cough now or in the last 14 days? No  Do you have any family members or close contacts with diagnosed or suspected Covid-19 in the past 14 days? No  Have you been tested for Covid-19 and found to be positive? Was tested in June and found to be negative per patient

## 2019-03-08 ENCOUNTER — Other Ambulatory Visit (INDEPENDENT_AMBULATORY_CARE_PROVIDER_SITE_OTHER): Payer: BC Managed Care – PPO

## 2019-03-08 ENCOUNTER — Other Ambulatory Visit: Payer: Self-pay

## 2019-03-08 ENCOUNTER — Encounter: Payer: Self-pay | Admitting: Gastroenterology

## 2019-03-08 ENCOUNTER — Ambulatory Visit: Payer: BC Managed Care – PPO | Admitting: Gastroenterology

## 2019-03-08 VITALS — BP 124/76 | HR 64 | Temp 97.9°F | Ht 73.0 in | Wt 255.4 lb

## 2019-03-08 DIAGNOSIS — R109 Unspecified abdominal pain: Secondary | ICD-10-CM

## 2019-03-08 DIAGNOSIS — R079 Chest pain, unspecified: Secondary | ICD-10-CM | POA: Diagnosis not present

## 2019-03-08 DIAGNOSIS — G8929 Other chronic pain: Secondary | ICD-10-CM

## 2019-03-08 LAB — CBC WITH DIFFERENTIAL/PLATELET
Basophils Absolute: 0.1 10*3/uL (ref 0.0–0.1)
Basophils Relative: 1.2 % (ref 0.0–3.0)
Eosinophils Absolute: 0.2 10*3/uL (ref 0.0–0.7)
Eosinophils Relative: 1.5 % (ref 0.0–5.0)
HCT: 44.4 % (ref 39.0–52.0)
Hemoglobin: 15.3 g/dL (ref 13.0–17.0)
Lymphocytes Relative: 20.8 % (ref 12.0–46.0)
Lymphs Abs: 2.7 10*3/uL (ref 0.7–4.0)
MCHC: 34.5 g/dL (ref 30.0–36.0)
MCV: 98.6 fl (ref 78.0–100.0)
Monocytes Absolute: 0.9 10*3/uL (ref 0.1–1.0)
Monocytes Relative: 7.3 % (ref 3.0–12.0)
Neutro Abs: 8.9 10*3/uL — ABNORMAL HIGH (ref 1.4–7.7)
Neutrophils Relative %: 69.2 % (ref 43.0–77.0)
Platelets: 213 10*3/uL (ref 150.0–400.0)
RBC: 4.5 Mil/uL (ref 4.22–5.81)
RDW: 14.3 % (ref 11.5–15.5)
WBC: 12.8 10*3/uL — ABNORMAL HIGH (ref 4.0–10.5)

## 2019-03-08 LAB — COMPREHENSIVE METABOLIC PANEL
ALT: 18 U/L (ref 0–53)
AST: 14 U/L (ref 0–37)
Albumin: 4.7 g/dL (ref 3.5–5.2)
Alkaline Phosphatase: 68 U/L (ref 39–117)
BUN: 19 mg/dL (ref 6–23)
CO2: 25 mEq/L (ref 19–32)
Calcium: 10.7 mg/dL — ABNORMAL HIGH (ref 8.4–10.5)
Chloride: 101 mEq/L (ref 96–112)
Creatinine, Ser: 1.14 mg/dL (ref 0.40–1.50)
GFR: 66.66 mL/min (ref >60.00)
Glucose, Bld: 116 mg/dL — ABNORMAL HIGH (ref 70–99)
Potassium: 3.9 mEq/L (ref 3.5–5.1)
Sodium: 137 mEq/L (ref 135–145)
Total Bilirubin: 0.5 mg/dL (ref 0.2–1.2)
Total Protein: 8.1 g/dL (ref 6.0–8.3)

## 2019-03-08 LAB — LIPASE: Lipase: 22 U/L (ref 11.0–59.0)

## 2019-03-08 LAB — MAGNESIUM: Magnesium: 1.9 mg/dL (ref 1.5–2.5)

## 2019-03-08 LAB — C-REACTIVE PROTEIN: CRP: <1 mg/dL (ref 0.5–20.0)

## 2019-03-08 LAB — FOLATE: Folate: >23.9 ng/mL (ref >5.9)

## 2019-03-08 LAB — VITAMIN B12: Vitamin B-12: 377 pg/mL (ref 211–911)

## 2019-03-08 MED ORDER — DICYCLOMINE HCL 10 MG PO CAPS: 10.0000 mg | ORAL_CAPSULE | Freq: Three times a day (TID) | ORAL | 0 refills | Status: DC

## 2019-03-08 MED ORDER — PANTOPRAZOLE SODIUM 40 MG PO TBEC: 40.0000 mg | DELAYED_RELEASE_TABLET | Freq: Every day | ORAL | 6 refills | Status: DC

## 2019-03-08 NOTE — Patient Instructions (Signed)
If you are age 55 or older, your body mass index should be between 23-30. Your Body mass index is 33.69 kg/m. If this is out of the aforementioned range listed, please consider follow up with your Primary Care Provider.  If you are age 76 or younger, your body mass index should be between 19-25. Your Body mass index is 33.69 kg/m. If this is out of the aformentioned range listed, please consider follow up with your Primary Care Provider.   Please go to the lab at [facility] ([place] You will need to go to level "B", you do not need an appointment for this. Hours available are 7:30 am - 4:30 pm.   We have sent the following medications to your pharmacy for you to pick up at your convenience: Protonix Bentyl  Follow up in 12 weeks.   Thank you,  Dr. Jackquline Denmark

## 2019-03-08 NOTE — Progress Notes (Signed)
Chief Complaint: Abdominal pain  Referring Provider:  Dr Mathis Bud      ASSESSMENT AND PLAN;   #1. Chest Pain with ? Abn stress test (awaiting cardiac WU with Dr Tomie China, may need cardiac cath)  #2. GERD.   #3. Chronic abdominal pain - likely abdominal wall pain, negative CT 12/01/2017 for any SBO.   #4.  Likely Mild Crohn's disease involving terminal ileum. Dx . Colon 01/2016-few erosions in the terminal ileum, mild activity. Bx- neg.  No meds. Asymptomatic.  #2. Large vantral hernia s/p repair complicated by PBSO requiring laparoscopy Jan 2018 at [facility].  Plan: -Proceed with cardiac work-up. -Check CBC, CMP, Mg, lipase, B12, folate, CRP -Change omeprazole to protonix 40mg  po qd. -Continue Bently 10mg  po qid #120  -Heating pads for musculoskeletal component of abdominal pain. -Once cardiac WU is complete, needs EGD and colon.  I have explained risks and benefits. -FU in 12 weeks, earlier in case of any problems.   HPI:   53 year old  Chest pain and epigastric pain Had cardiac stress test-apparently had EKG changes Currently undergoing work-up.  Has been advised cardiac catheterization. He has appointment with cardiology tomorrow.  From the GI standpoint, continued abdominal pain especially when he bends. "  Feels like the whole abdomen folds on itself".   Has nausea with occasional heartburn despite omeprazole.  No odynophagia or dysphagia.  He has history of diarrhea especially a at the frequency of 2-3 bowel movements per day without any nocturnal symptoms.   Past GI procedures: - CT A/P 12/01/2017 with p.o. and IV contrast-no acute abnormality, no small bowel obstruction, no abscesses, aortic atherosclerosis. -Colonoscopy 02/09/2016 ([facility])-colonic polyp status post polypectomy, few erosions in TI consistent with history of Crohn's disease.  Mild activity.  Mild pancolonic diverticulosis, sigmoid resection, internal hemorrhoids. -EGD 11/05/2009-healed distal esophageal  erosions.  Mild gastritis.  Past Medical History:  Diagnosis Date      . GI bleed   . Hypertension   . Hyperthyroidism   . Status post laparoscopy 12/2016   removed mesh from previous hernia repair that was causing a strangulated small intestine.    Past Surgical History:  Procedure Laterality Date  . APPENDECTOMY    . COLECTOMY    . COLONOSCOPY  01/2016   Colonic polyp status post polypectomy. Few erosions in the TI consistent with history of Crohns Disease. Mild pancolonic diverticulosis. Internal hemorrhoids.   . COLOSTOMY REVERSAL    . [place]    . HERNIA REPAIR    . UPPER GI ENDOSCOPY  2011   Healed esophageal erosions. Mild gastritis.     Family History  Problem Relation Age of Onset  . Hypothyroidism Mother   . Hypertension Mother   . Breast cancer Sister   . Bone cancer Brother   . Bone cancer Maternal Grandmother     Social History   Tobacco Use  . Smoking status: Current Every Day Smoker    Packs/day: 1.50  . Smokeless tobacco: Never Used  Substance Use Topics  . Alcohol use: Not Currently  . Drug use: Never    Current Outpatient Medications  Medication Sig Dispense Refill  . acetaminophen (TYLENOL) 500 MG tablet Take 4 tablets by mouth daily.    Marland Kitchen albuterol (VENTOLIN HFA) 108 (90 Base) MCG/ACT inhaler 2 puffs daily.     Marland Kitchen aspirin EC 81 MG tablet Take 81 mg by mouth daily.    Marland Kitchen atorvastatin (LIPITOR) 20 MG tablet Take 20 mg by mouth daily.    Marland Kitchen  dipyridamole-aspirin (AGGRENOX) 200-25 MG 12hr capsule Take 1 capsule by mouth daily.     Marland Kitchen docusate sodium (COLACE) 100 MG capsule Take 100 mg by mouth 2 (two) times daily as needed for mild constipation.    . fluticasone (FLONASE) 50 MCG/ACT nasal spray 1 spray by Each Nare route daily.    . furosemide (LASIX) 20 MG tablet Take 20 mg by mouth 2 (two) times daily.     Marland Kitchen gabapentin (NEURONTIN) 300 MG capsule 1 capsule as needed.    Marland Kitchen losartan-hydrochlorothiazide (HYZAAR) 50-12.5 MG tablet Take 1 tablet by  mouth daily.    . methimazole (TAPAZOLE) 5 MG tablet Take 5 mg by mouth daily.    . montelukast (SINGULAIR) 10 MG tablet Take 10 mg by mouth at bedtime.    . nitroGLYCERIN (NITROSTAT) 0.4 MG SL tablet Place 1 tablet (0.4 mg total) under the tongue every 5 (five) minutes as needed. 25 tablet 3  . omeprazole (PRILOSEC) 20 MG capsule Take 20 mg by mouth daily.    . valACYclovir (VALTREX) 500 MG tablet Take 500 mg by mouth 2 (two) times daily.    . Vitamin D, Ergocalciferol, (DRISDOL) 50000 units CAPS capsule Take 50,000 Units by mouth every 7 (seven) days.     No current facility-administered medications for this visit.     No Known Allergies  Review of Systems:  neg     Physical Exam:    BP 124/76   Pulse 64   Temp 97.9 F (36.6 C)   Ht 6\' 1"  (1.854 m)   Wt 255 lb 6 oz (115.8 kg)   BMI 33.69 kg/m  Filed Weights   03/08/19 0905  Weight: 255 lb 6 oz (115.8 kg)   Constitutional:  Well-developed, in no acute distress. Psychiatric: Normal mood and affect. Behavior is normal. HEENT: Pupils normal.  Conjunctivae are normal. No scleral icterus. Neck supple.  Cardiovascular: Normal rate, regular rhythm. No edema Pulmonary/chest: Effort normal and breath sounds normal. No wheezing, rales or rhonchi. Abdominal: Soft, nondistended.  Generalized abdominal tenderness. bowel sounds active throughout. There are no masses palpable. No hepatomegaly.  Multiple well-healed scars  Rectal:  defered Neurological: Alert and oriented to person place and time. Skin: Skin is warm and dry. No rashes noted.  Data Reviewed:   CT scan of the abdomen and pelvis 12/01/2017 with p.o. and IV contrast-no acute abnormality, no small bowel obstruction, no abscesses, aortic atherosclerosis.   Carmell Austria, MD   Cc: Dr Rica Records

## 2019-03-09 ENCOUNTER — Telehealth (INDEPENDENT_AMBULATORY_CARE_PROVIDER_SITE_OTHER): Payer: BC Managed Care – PPO | Admitting: Cardiology

## 2019-03-09 ENCOUNTER — Encounter: Payer: Self-pay | Admitting: Cardiology

## 2019-03-09 VITALS — BP 138/83 | HR 59 | Ht 73.0 in | Wt 251.6 lb

## 2019-03-09 DIAGNOSIS — F17210 Nicotine dependence, cigarettes, uncomplicated: Secondary | ICD-10-CM

## 2019-03-09 DIAGNOSIS — G459 Transient cerebral ischemic attack, unspecified: Secondary | ICD-10-CM

## 2019-03-09 DIAGNOSIS — I209 Angina pectoris, unspecified: Secondary | ICD-10-CM | POA: Diagnosis not present

## 2019-03-09 DIAGNOSIS — Z01818 Encounter for other preprocedural examination: Secondary | ICD-10-CM

## 2019-03-09 DIAGNOSIS — R0609 Other forms of dyspnea: Secondary | ICD-10-CM | POA: Diagnosis not present

## 2019-03-09 DIAGNOSIS — I10 Essential (primary) hypertension: Secondary | ICD-10-CM

## 2019-03-09 DIAGNOSIS — Z01812 Encounter for preprocedural laboratory examination: Secondary | ICD-10-CM

## 2019-03-09 DIAGNOSIS — R0600 Dyspnea, unspecified: Secondary | ICD-10-CM

## 2019-03-09 DIAGNOSIS — R9439 Abnormal result of other cardiovascular function study: Secondary | ICD-10-CM | POA: Diagnosis not present

## 2019-03-09 HISTORY — DX: Angina pectoris, unspecified: I20.9

## 2019-03-09 NOTE — Progress Notes (Signed)
 Virtual Visit via Telephone Note   This visit type was conducted due to national recommendations for restrictions regarding the COVID-19 Pandemic (e.g. social distancing) in an effort to limit this patient's exposure and mitigate transmission in our community.  Due to his co-morbid illnesses, this patient is at least at moderate risk for complications without adequate follow up.  This format is felt to be most appropriate for this patient at this time.  The patient did not have access to video technology/had technical difficulties with video requiring transitioning to audio format only (telephone).  All issues noted in this document were discussed and addressed.  No physical exam could be performed with this format.  Please refer to the patient's chart for his  consent to telehealth for [facility].   Date:  03/09/2019   ID:  Calvin Wilkerson, DOB 12/10/1965, MRN [id number]  Patient Location: Home Provider Location: Office  PCP:  Uppin, Nina, MD  Cardiologist:  No primary care provider on file.  Electrophysiologist:  None   Evaluation Performed:  Follow-Up Visit  Chief Complaint: Angina pectoris  History of Present Illness:    Calvin Wilkerson is a 53 y.o. male with past medical history of essential hypertension and smoking.  Patient had symptoms very consistent with angina pectoris and his stress test was abnormal.  He saw my partner last time and refused coronary angiography.  I had an extensive chat with him about the symptoms and my concerns.  His symptoms are very typical.  He has not used nitroglycerin.  He takes aspirin on a regular basis.  At the time of my evaluation, the patient is alert awake oriented and in no distress.  The patient does not have symptoms concerning for COVID-19 infection (fever, chills, cough, or new shortness of breath).    Past Medical History:  Diagnosis Date  . Aortic atherosclerosis (HCC)   . COPD (chronic obstructive pulmonary disease) (HCC)   . Crohn's  disease (HCC)   . Erectile dysfunction   . GERD (gastroesophageal reflux disease)   . GI bleed   . History of diverticulitis   . History of recurrent TIAs   . Hypertension   . Hyperthyroidism   . Mood disorder (HCC)   . PVD (peripheral vascular disease) (HCC)   . Status post laparoscopy 12/2016   removed mesh from previous hernia repair that was causing a strangulated small intestine.  . Vitamin D deficiency    Past Surgical History:  Procedure Laterality Date  . APPENDECTOMY    . COLECTOMY    . COLONOSCOPY  01/2016   Colonic polyp status post polypectomy. Few erosions in the TI consistent with history of Crohns Disease. Mild pancolonic diverticulosis. Internal hemorrhoids.   . COLOSTOMY REVERSAL    . Hartmanns    . HERNIA REPAIR    . UPPER GI ENDOSCOPY  2011   Healed esophageal erosions. Mild gastritis.      Current Meds  Medication Sig  . acetaminophen (TYLENOL) 500 MG tablet Take 4 tablets by mouth daily.  . albuterol (VENTOLIN HFA) 108 (90 Base) MCG/ACT inhaler 2 puffs daily.   . aspirin EC 81 MG tablet Take 81 mg by mouth daily.  . atorvastatin (LIPITOR) 20 MG tablet Take 20 mg by mouth daily.  . dicyclomine (BENTYL) 10 MG capsule Take 1 capsule (10 mg total) by mouth 4 (four) times daily -  before meals and at bedtime.  . dipyridamole-aspirin (AGGRENOX) 200-25 MG 12hr capsule Take 1 capsule by mouth daily.   .   docusate sodium (COLACE) 100 MG capsule Take 100 mg by mouth 2 (two) times daily as needed for mild constipation.  . fluticasone (FLONASE) 50 MCG/ACT nasal spray 1 spray by Each Nare route daily.  . furosemide (LASIX) 20 MG tablet Take 20 mg by mouth 2 (two) times daily.   Marland Kitchen gabapentin (NEURONTIN) 300 MG capsule 1 capsule as needed.  Marland Kitchen losartan-hydrochlorothiazide (HYZAAR) 50-12.5 MG tablet Take 1 tablet by mouth daily.  . methimazole (TAPAZOLE) 5 MG tablet Take 5 mg by mouth daily.  . montelukast (SINGULAIR) 10 MG tablet Take 10 mg by mouth at bedtime.  .  nitroGLYCERIN (NITROSTAT) 0.4 MG SL tablet Place 1 tablet (0.4 mg total) under the tongue every 5 (five) minutes as needed.  . pantoprazole (PROTONIX) 40 MG tablet Take 1 tablet (40 mg total) by mouth daily.  . valACYclovir (VALTREX) 500 MG tablet Take 500 mg by mouth 2 (two) times daily.  . Vitamin D, Ergocalciferol, (DRISDOL) 50000 units CAPS capsule Take 50,000 Units by mouth every 7 (seven) days.     Allergies:   Patient has no known allergies.   Social History   Tobacco Use  . Smoking status: Current Every Day Smoker    Packs/day: 1.50  . Smokeless tobacco: Never Used  Substance Use Topics  . Alcohol use: Not Currently  . Drug use: Never     Family Hx: The patient's family history includes Bone cancer in his brother and maternal grandmother; Breast cancer in his sister; Hypertension in his mother; Hypothyroidism in his mother.  ROS:   Please see the history of present illness.    As mentioned above All other systems reviewed and are negative.   Prior CV studies:   The following studies were reviewed today:  Stress test report was detailed with the patient at length  Labs/Other Tests and Data Reviewed:    EKG:  Prior EKGs were reviewed  Recent Labs: 03/08/2019: ALT 18; BUN 19; Creatinine, Ser 1.14; Hemoglobin 15.3; Magnesium 1.9; Platelets 213.0; Potassium 3.9; Sodium 137   Recent Lipid Panel No results found for: CHOL, TRIG, HDL, CHOLHDL, LDLCALC, LDLDIRECT  Wt Readings from Last 3 Encounters:  03/09/19 251 lb 9.6 oz (114.1 kg)  03/08/19 255 lb 6 oz (115.8 kg)  02/19/19 250 lb (113.4 kg)     Objective:    Vital Signs:  BP 138/83 (BP Location: Left Arm, Patient Position: Sitting)   Pulse (!) 59   Ht 6\' 1"  (1.854 m)   Wt 251 lb 9.6 oz (114.1 kg)   BMI 33.19 kg/m    VITAL SIGNS:  reviewed  ASSESSMENT & PLAN:    1. Angina pectoris: Patient symptoms are very concerning.  This is in addition to his stress test which is abnormal clearly.I discussed coronary  angiography and left heart catheterization with the patient at extensive length. Procedure, benefits and potential risks were explained. Patient had multiple questions which were answered to the patient's satisfaction. Patient agreed and consented for the procedure. Further recommendations will be made based on the findings of the coronary angiography. In the interim. The patient has any significant symptoms he knows to go to the nearest emergency room. 2. Essential hypertension: Blood pressure stable 3. Mixed dyslipidemia: Diet was discussed.  He will be back in the next few days for a baseline EKG, Chem-7, CBC, TSH, liver lipid check in preparation for coronary angiography. 4. He knows to go to the nearest emergency room for any concerning symptoms.  He will be seen in  follow-up appointment after the coronary angiography. 5.   COVID-19 Education: The signs and symptoms of COVID-19 were discussed with the patient and how to seek care for testing (follow up with PCP or arrange E-visit).  The importance of social distancing was discussed today.  Time:   Today, I have spent 22 minutes with the patient with telehealth technology discussing the above problems.     Medication Adjustments/Labs and Tests Ordered: Current medicines are reviewed at length with the patient today.  Concerns regarding medicines are outlined above.   Tests Ordered: No orders of the defined types were placed in this encounter.   Medication Changes: No orders of the defined types were placed in this encounter.   Follow Up:  Virtual Visit or In Person 2 to 3 weeks after coronary angiography.  Signed, Garwin Brothers, MD  03/09/2019 9:18 AM    [facility]

## 2019-03-09 NOTE — Patient Instructions (Addendum)
Medication Instructions:  Your physician recommends that you continue on your current medications as directed. Please refer to the Current Medication list given to you today.  If you need a refill on your cardiac medications before your next appointment, please call your pharmacy.   Lab work: NONE If you have labs (blood work) drawn today and your tests are completely normal, you will receive your results only by: Marland Kitchen. MyChart Message (if you have MyChart) OR . A paper copy in the mail If you have any lab test that is abnormal or we need to change your treatment, we will call you to review the results.  Testing/Procedures: YOU ARE SCHEDULED for covid screening on 03/12/19 at 1:45 pm at [facility] [address] [place], [place] [address]. Make sure you get in pre procedure line under awning.    A chest x-ray takes a picture of the organs and structures inside the chest, including the heart, lungs, and blood vessels. This test can show several things, including, whether the heart is enlarges; whether fluid is building up in the lungs; and whether pacemaker / defibrillator leads are still in place.     [facility] [place] [address], [address] [place] [address] Dept: [phone] Loc: [phone]  Hermenia FiscalRoger Pembroke  03/09/2019  You are scheduled for a Cardiac Catheterization on Friday, August 14 with Dr. Tonny BollmanMichael Cooper.  1. [facility] at [facility]: [address] [place], [place] [address] at 8:30 AM (This time is two hours before your procedure to ensure your preparation). Free valet parking service is available.   Special note: Every effort is made to have your procedure done on time. Please understand that emergencies sometimes delay scheduled procedures.  2. Diet: Do not eat solid foods after midnight.  The patient may have clear liquids until  5am upon the day of the procedure.  3. Labs: NONE  4. Medication instructions in preparation for your procedure:   Contrast Allergy: No  Stop taking, Lasix (Furosemide)  and losartan-HCTZ Thursday, August 13,  On the morning of your procedure, take your Aspirin and any morning medicines NOT listed above.  You may use sips of water.  5. Plan for one night stay--bring personal belongings. 6. Bring a current list of your medications and current insurance cards. 7. You MUST have a responsible person to drive you home. 8. Someone MUST be with you the first 24 hours after you arrive home or your discharge will be delayed. 9. Please wear clothes that are easy to get on and off and wear slip-on shoes.  Thank you for allowing us to care for you!   -- [facility]    Follow-Up: At [facility], you and your health needs are our priority.  As part of our continuing mission to provide you with exceptional heart care, we have created designated Provider Care Teams.  These Care Teams include your primary Cardiologist (physician) and Advanced Practice Providers (APPs -  Physician Assistants and Nurse Practitioners) who all work together to provide you with the care you need, when you need it. You will need a follow up appointment in 1 months.     Any Other Special Instructions Will Be Listed Below   Coronary Angiogram A coronary angiogram is an X-ray procedure that is used to examine the arteries in the heart. In this procedure, a dye (  contrast dye) is injected through a long, thin tube (catheter). The catheter is inserted through the groin, wrist, or arm. The dye is injected into each artery, then X-rays are taken to show if there is a blockage in the arteries of the heart. This procedure can also show if you have valve disease or a disease of the aorta, and it can be used to check the overall function of your heart muscle. You may have a coronary angiogram if:   You are having chest pain, or other symptoms of angina, and you are at risk for heart disease.  You have an abnormal electrocardiogram (ECG) or stress test.  You have chest pain and heart failure.  You are having irregular heart rhythms.  You and your health care provider determine that the benefits of the test information outweigh the risks of the procedure. Let your health care provider know about:  Any allergies you have, including allergies to contrast dye.  All medicines you are taking, including vitamins, herbs, eye drops, creams, and over-the-counter medicines.  Any problems you or family members have had with anesthetic medicines.  Any blood disorders you have.  Any surgeries you have had.  History of kidney problems or kidney failure.  Any medical conditions you have.  Whether you are pregnant or may be pregnant. What are the risks? Generally, this is a safe procedure. However, problems may occur, including:  Infection.  Allergic reaction to medicines or dyes that are used.  Bleeding from the access site or other locations.  Kidney injury, especially in people with impaired kidney function.  Stroke (rare).  Heart attack (rare).  Damage to other structures or organs. What happens before the procedure? Staying hydrated Follow instructions from your health care provider about hydration, which may include:  Up to 2 hours before the procedure - you may continue to drink clear liquids, such as water, clear fruit juice, black coffee, and plain tea. Eating and drinking restrictions Follow instructions from your health care provider about eating and drinking, which may include:  8 hours before the procedure - stop eating heavy meals or foods such as meat, fried foods, or fatty foods.  6 hours before the procedure - stop eating light meals or foods, such as toast or cereal.  2 hours before the procedure - stop drinking clear liquids. General instructions  Ask  your health care provider about: ? Changing or stopping your regular medicines. This is especially important if you are taking diabetes medicines or blood thinners. ? Taking medicines such as ibuprofen. These medicines can thin your blood. Do not take these medicines before your procedure if your health care provider instructs you not to, though aspirin may be recommended prior to coronary angiograms.  Plan to have someone take you home from the hospital or clinic.  You may need to have blood tests or X-rays done. What happens during the procedure?  An IV tube will be inserted into one of your veins.  You will be given one or more of the following: ? A medicine to help you relax (sedative). ? A medicine to numb the area where the catheter will be inserted into an artery (local anesthetic).  To reduce your risk of infection: ? Your health care team will wash or sanitize their hands. ? Your skin will be washed with soap. ? Hair may be removed from the area where the catheter will be inserted.  You will be connected to a continuous ECG monitor.  The catheter will  be inserted into an artery. The location may be in your groin, in your wrist, or in the fold of your arm (near your elbow).  A type of X-ray (fluoroscopy) will be used to help guide the catheter to the opening of the blood vessel that is being examined.  A dye will be injected into the catheter, and X-rays will be taken. The dye will help to show where any narrowing or blockages are located in the heart arteries.  Tell your health care provider if you have any chest pain or trouble breathing during the procedure.  If blockages are found, your health care provider may perform another procedure, such as inserting a coronary stent. The procedure may vary among health care providers and hospitals. What happens after the procedure?  After the procedure, you will need to keep the area still for a few hours, or for as long as told by  your health care provider. If the procedure is done through the groin, you will be instructed to not bend and not cross your legs.  The insertion site will be checked frequently.  The pulse in your foot or wrist will be checked frequently.  You may have additional blood tests, X-rays, and a test that records the electrical activity of your heart (ECG).  Do not drive for 24 hours if you were given a sedative. Summary  A coronary angiogram is an X-ray procedure that is used to look into the arteries in the heart.  During the procedure, a dye (contrast dye) is injected through a long, thin tube (catheter). The catheter is inserted through the groin, wrist, or arm.  Tell your health care provider about any allergies you have, including allergies to contrast dye.  After the procedure, you will need to keep the area still for a few hours, or for as long as told by your health care provider. This information is not intended to replace advice given to you by your health care provider. Make sure you discuss any questions you have with your health care provider. Document Released: 01/23/2003 Document Revised: 07/01/2017 Document Reviewed: 04/30/2016 Elsevier Patient Education  2020 [employer]

## 2019-03-09 NOTE — Addendum Note (Signed)
Addended by: Beckey Rutter on: 03/09/2019 12:37 PM   Modules accepted: Orders

## 2019-03-09 NOTE — H&P (View-Only) (Signed)
Virtual Visit via Telephone Note   This visit type was conducted due to national recommendations for restrictions regarding the COVID-19 Pandemic (e.g. social distancing) in an effort to limit this patient's exposure and mitigate transmission in our community.  Due to his co-morbid illnesses, this patient is at least at moderate risk for complications without adequate follow up.  This format is felt to be most appropriate for this patient at this time.  The patient did not have access to video technology/had technical difficulties with video requiring transitioning to audio format only (telephone).  All issues noted in this document were discussed and addressed.  No physical exam could be performed with this format.  Please refer to the patient's chart for his  consent to telehealth for [facility].   Date:  03/09/2019   ID:  Calvin Fiscaloger Wilkerson, DOB [birth year], MRN [id number]  Patient Location: Home Provider Location: Office  PCP:  Lucianne LeiUppin, Nina, MD  Cardiologist:  No primary care provider on file.  Electrophysiologist:  None   Evaluation Performed:  Follow-Up Visit  Chief Complaint: Angina pectoris  History of Present Illness:    Calvin Wilkerson is a 53 y.o. male with past medical history of essential hypertension and smoking.  Patient had symptoms very consistent with angina pectoris and his stress test was abnormal.  He saw my partner last time and refused coronary angiography.  I had an extensive chat with him about the symptoms and my concerns.  His symptoms are very typical.  He has not used nitroglycerin.  He takes aspirin on a regular basis.  At the time of my evaluation, the patient is alert awake oriented and in no distress.  The patient does not have symptoms concerning for COVID-19 infection (fever, chills, cough, or new shortness of breath).    Past Medical History:  Diagnosis Date  . Aortic atherosclerosis (HCC)   . COPD (chronic obstructive pulmonary disease) (HCC)   . Crohn's  disease (HCC)   . Erectile dysfunction   . GERD (gastroesophageal reflux disease)   . GI bleed   . History of diverticulitis   . History of recurrent TIAs   . Hypertension   . Hyperthyroidism   . Mood disorder (HCC)   . PVD (peripheral vascular disease) (HCC)   . Status post laparoscopy 12/2016   removed mesh from previous hernia repair that was causing a strangulated small intestine.  . Vitamin D deficiency    Past Surgical History:  Procedure Laterality Date  . APPENDECTOMY    . COLECTOMY    . COLONOSCOPY  01/2016   Colonic polyp status post polypectomy. Few erosions in the TI consistent with history of Crohns Disease. Mild pancolonic diverticulosis. Internal hemorrhoids.   . COLOSTOMY REVERSAL    . [place]mann[place]    . HERNIA REPAIR    . UPPER GI ENDOSCOPY  2011   Healed esophageal erosions. Mild gastritis.      Current Meds  Medication Sig  . acetaminophen (TYLENOL) 500 MG tablet Take 4 tablets by mouth daily.  Marland Kitchen. albuterol (VENTOLIN HFA) 108 (90 Base) MCG/ACT inhaler 2 puffs daily.   Marland Kitchen. aspirin EC 81 MG tablet Take 81 mg by mouth daily.  Marland Kitchen. atorvastatin (LIPITOR) 20 MG tablet Take 20 mg by mouth daily.  Marland Kitchen. dicyclomine (BENTYL) 10 MG capsule Take 1 capsule (10 mg total) by mouth 4 (four) times daily -  before meals and at bedtime.  . dipyridamole-aspirin (AGGRENOX) 200-25 MG 12hr capsule Take 1 capsule by mouth daily.   .Marland Kitchen  docusate sodium (COLACE) 100 MG capsule Take 100 mg by mouth 2 (two) times daily as needed for mild constipation.  . fluticasone (FLONASE) 50 MCG/ACT nasal spray 1 spray by Each Nare route daily.  . furosemide (LASIX) 20 MG tablet Take 20 mg by mouth 2 (two) times daily.   Marland Kitchen gabapentin (NEURONTIN) 300 MG capsule 1 capsule as needed.  Marland Kitchen losartan-hydrochlorothiazide (HYZAAR) 50-12.5 MG tablet Take 1 tablet by mouth daily.  . methimazole (TAPAZOLE) 5 MG tablet Take 5 mg by mouth daily.  . montelukast (SINGULAIR) 10 MG tablet Take 10 mg by mouth at bedtime.  .  nitroGLYCERIN (NITROSTAT) 0.4 MG SL tablet Place 1 tablet (0.4 mg total) under the tongue every 5 (five) minutes as needed.  . pantoprazole (PROTONIX) 40 MG tablet Take 1 tablet (40 mg total) by mouth daily.  . valACYclovir (VALTREX) 500 MG tablet Take 500 mg by mouth 2 (two) times daily.  . Vitamin D, Ergocalciferol, (DRISDOL) 50000 units CAPS capsule Take 50,000 Units by mouth every 7 (seven) days.     Allergies:   Patient has no known allergies.   Social History   Tobacco Use  . Smoking status: Current Every Day Smoker    Packs/day: 1.50  . Smokeless tobacco: Never Used  Substance Use Topics  . Alcohol use: Not Currently  . Drug use: Never     Family Hx: The patient's family history includes Bone cancer in his brother and maternal grandmother; Breast cancer in his sister; Hypertension in his mother; Hypothyroidism in his mother.  ROS:   Please see the history of present illness.    As mentioned above All other systems reviewed and are negative.   Prior CV studies:   The following studies were reviewed today:  Stress test report was detailed with the patient at length  Labs/Other Tests and Data Reviewed:    EKG:  Prior EKGs were reviewed  Recent Labs: 03/08/2019: ALT 18; BUN 19; Creatinine, Ser 1.14; Hemoglobin 15.3; Magnesium 1.9; Platelets 213.0; Potassium 3.9; Sodium 137   Recent Lipid Panel No results found for: CHOL, TRIG, HDL, CHOLHDL, LDLCALC, LDLDIRECT  Wt Readings from Last 3 Encounters:  03/09/19 251 lb 9.6 oz (114.1 kg)  03/08/19 255 lb 6 oz (115.8 kg)  02/19/19 250 lb (113.4 kg)     Objective:    Vital Signs:  BP 138/83 (BP Location: Left Arm, Patient Position: Sitting)   Pulse (!) 59   Ht 6\' 1"  (1.854 m)   Wt 251 lb 9.6 oz (114.1 kg)   BMI 33.19 kg/m    VITAL SIGNS:  reviewed  ASSESSMENT & PLAN:    1. Angina pectoris: Patient symptoms are very concerning.  This is in addition to his stress test which is abnormal clearly.I discussed coronary  angiography and left heart catheterization with the patient at extensive length. Procedure, benefits and potential risks were explained. Patient had multiple questions which were answered to the patient's satisfaction. Patient agreed and consented for the procedure. Further recommendations will be made based on the findings of the coronary angiography. In the interim. The patient has any significant symptoms he knows to go to the nearest emergency room. 2. Essential hypertension: Blood pressure stable 3. Mixed dyslipidemia: Diet was discussed.  He will be back in the next few days for a baseline EKG, Chem-7, CBC, TSH, liver lipid check in preparation for coronary angiography. 4. He knows to go to the nearest emergency room for any concerning symptoms.  He will be seen in  follow-up appointment after the coronary angiography. 5.   COVID-19 Education: The signs and symptoms of COVID-19 were discussed with the patient and how to seek care for testing (follow up with PCP or arrange E-visit).  The importance of social distancing was discussed today.  Time:   Today, I have spent 22 minutes with the patient with telehealth technology discussing the above problems.     Medication Adjustments/Labs and Tests Ordered: Current medicines are reviewed at length with the patient today.  Concerns regarding medicines are outlined above.   Tests Ordered: No orders of the defined types were placed in this encounter.   Medication Changes: No orders of the defined types were placed in this encounter.   Follow Up:  Virtual Visit or In Person 2 to 3 weeks after coronary angiography.  Signed, Garwin Brothers, MD  03/09/2019 9:18 AM    [facility]

## 2019-03-12 ENCOUNTER — Inpatient Hospital Stay (HOSPITAL_COMMUNITY): Admission: RE | Admit: 2019-03-12 | Payer: BC Managed Care – PPO | Source: Ambulatory Visit

## 2019-03-12 DIAGNOSIS — R9439 Abnormal result of other cardiovascular function study: Secondary | ICD-10-CM | POA: Diagnosis not present

## 2019-03-12 DIAGNOSIS — Z01818 Encounter for other preprocedural examination: Secondary | ICD-10-CM | POA: Diagnosis not present

## 2019-03-12 DIAGNOSIS — I209 Angina pectoris, unspecified: Secondary | ICD-10-CM | POA: Diagnosis not present

## 2019-03-13 ENCOUNTER — Other Ambulatory Visit (HOSPITAL_COMMUNITY)
Admission: RE | Admit: 2019-03-13 | Discharge: 2019-03-13 | Disposition: A | Discharge status: Home / Self Care | Payer: BC Managed Care – PPO | Source: Ambulatory Visit | Attending: Cardiovascular Disease | Admitting: Cardiovascular Disease

## 2019-03-13 DIAGNOSIS — Z20828 Contact with and (suspected) exposure to other viral communicable diseases: Secondary | ICD-10-CM | POA: Insufficient documentation

## 2019-03-13 DIAGNOSIS — Z01812 Encounter for preprocedural laboratory examination: Secondary | ICD-10-CM | POA: Diagnosis not present

## 2019-03-13 LAB — SARS CORONAVIRUS 2 (TAT 6-24 HRS): SARS Coronavirus 2: NEGATIVE

## 2019-03-15 ENCOUNTER — Telehealth: Payer: Self-pay | Admitting: *Deleted

## 2019-03-15 NOTE — Telephone Encounter (Signed)
Pt contacted pre-catheterization scheduled at [facility] for: Friday March 16, 2019 10:30 AM Verified arrival time and place: [facility]) at: 8:30 AM   No solid food after midnight prior to cath, clear liquids until 5 AM day of procedure. Contrast allergy: no  Hold: Lasix-AM of procedure. Losartan-HCT-AM of procedure.  Except hold medications AM meds can be  taken pre-cath with sip of water including: ASA 81 mg   Confirmed patient has responsible person to drive home post procedure and observe 24 hours after arriving home: yes  Due to Covid-19 pandemic, only one support person will be allowed with patient. Must be the same support person for that patient's entire stay, will be screened and required to wear a mask.   Patients are required to wear a mask when they enter the hospital.      COVID-19 Pre-Screening Questions:  . In the past 7 to 10 days have you had a cough,  shortness of breath, headache, congestion, fever (100 or greater) body aches, chills, sore throat, or sudden loss of taste or sense of smell? no . Have you been around anyone with known Covid 19? no . Have you been around anyone who is awaiting Covid 19 test results in the past 7 to 10 days? Yes-wife is nurse and required to test every 2 weeks, never tested positive, no symptoms . Have you been around anyone who has been exposed to Covid 19, or has mentioned symptoms of Covid 19 within the past 7 to 10 days? no  I reviewed procedure/mask/visitor instructions, Covid-19 screening questions with patient, he verbalized understanding, thanked me for call.

## 2019-03-16 ENCOUNTER — Other Ambulatory Visit: Payer: Self-pay

## 2019-03-16 ENCOUNTER — Ambulatory Visit (HOSPITAL_COMMUNITY)
Admission: RE | Admit: 2019-03-16 | Discharge: 2019-03-16 | Disposition: A | Discharge status: Home / Self Care | Payer: BC Managed Care – PPO | Attending: Cardiovascular Disease | Admitting: Cardiovascular Disease

## 2019-03-16 ENCOUNTER — Encounter (HOSPITAL_COMMUNITY)
Admission: RE | Disposition: A | Discharge status: Home / Self Care | Payer: BC Managed Care – PPO | Source: Home / Self Care | Attending: Cardiovascular Disease

## 2019-03-16 DIAGNOSIS — K219 Gastro-esophageal reflux disease without esophagitis: Secondary | ICD-10-CM | POA: Diagnosis not present

## 2019-03-16 DIAGNOSIS — I10 Essential (primary) hypertension: Secondary | ICD-10-CM

## 2019-03-16 DIAGNOSIS — J449 Chronic obstructive pulmonary disease, unspecified: Secondary | ICD-10-CM | POA: Diagnosis not present

## 2019-03-16 DIAGNOSIS — E785 Hyperlipidemia, unspecified: Secondary | ICD-10-CM | POA: Diagnosis not present

## 2019-03-16 DIAGNOSIS — R931 Abnormal findings on diagnostic imaging of heart and coronary circulation: Secondary | ICD-10-CM | POA: Diagnosis not present

## 2019-03-16 DIAGNOSIS — R9439 Abnormal result of other cardiovascular function study: Secondary | ICD-10-CM

## 2019-03-16 DIAGNOSIS — Z7982 Long term (current) use of aspirin: Secondary | ICD-10-CM | POA: Insufficient documentation

## 2019-03-16 DIAGNOSIS — Z8249 Family history of ischemic heart disease and other diseases of the circulatory system: Secondary | ICD-10-CM | POA: Insufficient documentation

## 2019-03-16 DIAGNOSIS — E0590 Thyrotoxicosis, unspecified without thyrotoxic crisis or storm: Secondary | ICD-10-CM | POA: Diagnosis not present

## 2019-03-16 DIAGNOSIS — Z8673 Personal history of transient ischemic attack (TIA), and cerebral infarction without residual deficits: Secondary | ICD-10-CM | POA: Diagnosis not present

## 2019-03-16 DIAGNOSIS — Z8349 Family history of other endocrine, nutritional and metabolic diseases: Secondary | ICD-10-CM | POA: Diagnosis not present

## 2019-03-16 DIAGNOSIS — I739 Peripheral vascular disease, unspecified: Secondary | ICD-10-CM | POA: Diagnosis not present

## 2019-03-16 DIAGNOSIS — F17210 Nicotine dependence, cigarettes, uncomplicated: Secondary | ICD-10-CM | POA: Diagnosis not present

## 2019-03-16 DIAGNOSIS — R0600 Dyspnea, unspecified: Secondary | ICD-10-CM

## 2019-03-16 DIAGNOSIS — N529 Male erectile dysfunction, unspecified: Secondary | ICD-10-CM | POA: Insufficient documentation

## 2019-03-16 DIAGNOSIS — Z79899 Other long term (current) drug therapy: Secondary | ICD-10-CM | POA: Diagnosis not present

## 2019-03-16 DIAGNOSIS — G459 Transient cerebral ischemic attack, unspecified: Secondary | ICD-10-CM

## 2019-03-16 DIAGNOSIS — R0609 Other forms of dyspnea: Secondary | ICD-10-CM

## 2019-03-16 DIAGNOSIS — I2510 Atherosclerotic heart disease of native coronary artery without angina pectoris: Secondary | ICD-10-CM | POA: Insufficient documentation

## 2019-03-16 DIAGNOSIS — Z955 Presence of coronary angioplasty implant and graft: Secondary | ICD-10-CM

## 2019-03-16 DIAGNOSIS — E559 Vitamin D deficiency, unspecified: Secondary | ICD-10-CM | POA: Insufficient documentation

## 2019-03-16 DIAGNOSIS — K5090 Crohn's disease, unspecified, without complications: Secondary | ICD-10-CM | POA: Insufficient documentation

## 2019-03-16 HISTORY — PX: CORONARY STENT INTERVENTION: CATH118234

## 2019-03-16 HISTORY — PX: LEFT HEART CATH AND CORONARY ANGIOGRAPHY: CATH118249

## 2019-03-16 LAB — POCT ACTIVATED CLOTTING TIME: Activated Clotting Time: 274 seconds

## 2019-03-16 SURGERY — LEFT HEART CATH AND CORONARY ANGIOGRAPHY
Anesthesia: LOCAL

## 2019-03-16 MED ORDER — SODIUM CHLORIDE 0.9% FLUSH: 3.0000 mL | Freq: Two times a day (BID) | INTRAVENOUS | Status: DC

## 2019-03-16 MED ORDER — HEPARIN (PORCINE) IN NACL 1000-0.9 UT/500ML-% IV SOLN
INTRAVENOUS | Status: AC
  Filled 2019-03-16: qty 1000

## 2019-03-16 MED ORDER — CLOPIDOGREL BISULFATE 75 MG PO TABS: 75.0000 mg | ORAL_TABLET | Freq: Every day | ORAL | 11 refills | Status: DC

## 2019-03-16 MED ORDER — HYDRALAZINE HCL 20 MG/ML IJ SOLN: 10.0000 mg | INTRAMUSCULAR | Status: DC | PRN

## 2019-03-16 MED ORDER — ONDANSETRON HCL 4 MG/2ML IJ SOLN: 4.0000 mg | Freq: Four times a day (QID) | INTRAMUSCULAR | Status: DC | PRN

## 2019-03-16 MED ORDER — SODIUM CHLORIDE 0.9% FLUSH: 3.0000 mL | INTRAVENOUS | Status: DC | PRN

## 2019-03-16 MED ORDER — LIDOCAINE HCL (PF) 1 % IJ SOLN
INTRAMUSCULAR | Status: DC | PRN
  Administered 2019-03-16: 2 mL

## 2019-03-16 MED ORDER — SODIUM CHLORIDE 0.9 % IV SOLN: 250.0000 mL | INTRAVENOUS | Status: DC | PRN

## 2019-03-16 MED ORDER — ASPIRIN 81 MG PO CHEW: 81.0000 mg | CHEWABLE_TABLET | ORAL | Status: DC

## 2019-03-16 MED ORDER — CLOPIDOGREL BISULFATE 300 MG PO TABS
ORAL_TABLET | ORAL | Status: DC | PRN
  Administered 2019-03-16: 600 mg via ORAL

## 2019-03-16 MED ORDER — FENTANYL CITRATE (PF) 100 MCG/2ML IJ SOLN
INTRAMUSCULAR | Status: DC | PRN
  Administered 2019-03-16 (×2): 25 ug via INTRAVENOUS
  Administered 2019-03-16: 50 ug via INTRAVENOUS

## 2019-03-16 MED ORDER — ASPIRIN 81 MG PO CHEW: 81.0000 mg | CHEWABLE_TABLET | Freq: Every day | ORAL | 0 refills | Status: DC

## 2019-03-16 MED ORDER — CLOPIDOGREL BISULFATE 300 MG PO TABS
ORAL_TABLET | ORAL | Status: AC
  Filled 2019-03-16: qty 2

## 2019-03-16 MED ORDER — FAMOTIDINE IN NACL 20-0.9 MG/50ML-% IV SOLN
INTRAVENOUS | Status: AC | PRN
  Administered 2019-03-16: 20 mg via INTRAVENOUS

## 2019-03-16 MED ORDER — VERAPAMIL HCL 2.5 MG/ML IV SOLN
INTRAVENOUS | Status: AC
  Filled 2019-03-16: qty 2

## 2019-03-16 MED ORDER — SODIUM CHLORIDE 0.9 % WEIGHT BASED INFUSION: 1.0000 mL/kg/h | INTRAVENOUS | Status: DC

## 2019-03-16 MED ORDER — CLOPIDOGREL BISULFATE 75 MG PO TABS: 75.0000 mg | ORAL_TABLET | Freq: Every day | ORAL | 0 refills | Status: DC

## 2019-03-16 MED ORDER — HEPARIN (PORCINE) IN NACL 1000-0.9 UT/500ML-% IV SOLN
INTRAVENOUS | Status: DC | PRN
  Administered 2019-03-16 (×2): 500 mL

## 2019-03-16 MED ORDER — IOHEXOL 350 MG/ML SOLN
INTRAVENOUS | Status: DC | PRN
  Administered 2019-03-16: 80 mL

## 2019-03-16 MED ORDER — HEPARIN SODIUM (PORCINE) 1000 UNIT/ML IJ SOLN
INTRAMUSCULAR | Status: DC | PRN
  Administered 2019-03-16: 6000 [IU] via INTRAVENOUS
  Administered 2019-03-16: 5000 [IU] via INTRAVENOUS

## 2019-03-16 MED ORDER — MIDAZOLAM HCL 2 MG/2ML IJ SOLN
INTRAMUSCULAR | Status: AC
  Filled 2019-03-16: qty 2

## 2019-03-16 MED ORDER — FENTANYL CITRATE (PF) 100 MCG/2ML IJ SOLN
INTRAMUSCULAR | Status: AC
  Filled 2019-03-16: qty 2

## 2019-03-16 MED ORDER — HEPARIN SODIUM (PORCINE) 1000 UNIT/ML IJ SOLN
INTRAMUSCULAR | Status: AC
  Filled 2019-03-16: qty 1

## 2019-03-16 MED ORDER — SODIUM CHLORIDE 0.9 % WEIGHT BASED INFUSION
3.0000 mL/kg/h | INTRAVENOUS | Status: AC
  Administered 2019-03-16: 3 mL/kg/h via INTRAVENOUS

## 2019-03-16 MED ORDER — NITROGLYCERIN 1 MG/10 ML FOR IR/CATH LAB
INTRA_ARTERIAL | Status: DC | PRN
  Administered 2019-03-16 (×2): 200 ug via INTRACORONARY

## 2019-03-16 MED ORDER — VERAPAMIL HCL 2.5 MG/ML IV SOLN
INTRAVENOUS | Status: DC | PRN
  Administered 2019-03-16 (×2): 10 mL via INTRA_ARTERIAL

## 2019-03-16 MED ORDER — LABETALOL HCL 5 MG/ML IV SOLN: 10.0000 mg | INTRAVENOUS | Status: DC | PRN

## 2019-03-16 MED ORDER — FAMOTIDINE IN NACL 20-0.9 MG/50ML-% IV SOLN
INTRAVENOUS | Status: AC
  Filled 2019-03-16: qty 50

## 2019-03-16 MED ORDER — ACETAMINOPHEN 325 MG PO TABS
650.0000 mg | ORAL_TABLET | ORAL | Status: DC | PRN
  Administered 2019-03-16: 650 mg via ORAL
  Filled 2019-03-16: qty 2

## 2019-03-16 MED ORDER — MIDAZOLAM HCL 2 MG/2ML IJ SOLN
INTRAMUSCULAR | Status: DC | PRN
  Administered 2019-03-16: 2 mg via INTRAVENOUS
  Administered 2019-03-16 (×2): 1 mg via INTRAVENOUS

## 2019-03-16 MED FILL — CLOPIDOGREL 75 MG TABLET: 75 | 30 days supply | Qty: 30 | Fill #0

## 2019-03-16 SURGICAL SUPPLY — 16 items
BALLN  ~~LOC~~ SAPPHIRE 4.5X15 (BALLOONS) ×1
BALLN ~~LOC~~ SAPPHIRE 4.5X15 (BALLOONS) ×1
BALLOON ~~LOC~~ SAPPHIRE 4.5X15 (BALLOONS) IMPLANT
CATH 5FR JL3.5 JR4 ANG PIG MP (CATHETERS) ×1 IMPLANT
CATH VISTA GUIDE 6FR JR4 (CATHETERS) ×1 IMPLANT
DEVICE RAD COMP TR BAND LRG (VASCULAR PRODUCTS) ×1 IMPLANT
GLIDESHEATH SLEND SS 6F .021 (SHEATH) ×1 IMPLANT
GUIDEWIRE INQWIRE 1.5J.035X260 (WIRE) IMPLANT
INQWIRE 1.5J .035X260CM (WIRE) ×2
KIT ENCORE 26 ADVANTAGE (KITS) ×1 IMPLANT
KIT HEART LEFT (KITS) ×2 IMPLANT
PACK CARDIAC CATHETERIZATION (CUSTOM PROCEDURE TRAY) ×2 IMPLANT
STENT SYNERGY DES 4X20 (Permanent Stent) ×1 IMPLANT
TRANSDUCER W/STOPCOCK (MISCELLANEOUS) ×2 IMPLANT
TUBING CIL FLEX 10 FLL-RA (TUBING) ×2 IMPLANT
WIRE COUGAR XT STRL 190CM (WIRE) ×1 IMPLANT

## 2019-03-16 NOTE — Discharge Instructions (Signed)
Radial Site Care ° °This sheet gives you information about how to care for yourself after your procedure. Your health care provider may also give you more specific instructions. If you have problems or questions, contact your health care provider. °What can I expect after the procedure? °After the procedure, it is common to have: °· Bruising and tenderness at the catheter insertion area. °Follow these instructions at home: °Medicines °· Take over-the-counter and prescription medicines only as told by your health care provider. °Insertion site care °· Follow instructions from your health care provider about how to take care of your insertion site. Make sure you: °? Wash your hands with soap and water before you change your bandage (dressing). If soap and water are not available, use hand sanitizer. °? Change your dressing as told by your health care provider. °? Leave stitches (sutures), skin glue, or adhesive strips in place. These skin closures may need to stay in place for 2 weeks or longer. If adhesive strip edges start to loosen and curl up, you may trim the loose edges. Do not remove adhesive strips completely unless your health care provider tells you to do that. °· Check your insertion site every day for signs of infection. Check for: °? Redness, swelling, or pain. °? Fluid or blood. °? Pus or a bad smell. °? Warmth. °· Do not take baths, swim, or use a hot tub until your health care provider approves. °· You may shower 24-48 hours after the procedure, or as directed by your health care provider. °? Remove the dressing and gently wash the site with plain soap and water. °? Pat the area dry with a clean towel. °? Do not rub the site. That could cause bleeding. °· Do not apply powder or lotion to the site. °Activity ° °· For 24 hours after the procedure, or as directed by your health care provider: °? Do not flex or bend the affected arm. °? Do not push or pull heavy objects with the affected arm. °? Do not  drive yourself home from the hospital or clinic. You may drive 24 hours after the procedure unless your health care provider tells you not to. °? Do not operate machinery or power tools. °· Do not lift anything that is heavier than 10 lb (4.5 kg), or the limit that you are told, until your health care provider says that it is safe. °· Ask your health care provider when it is okay to: °? Return to work or school. °? Resume usual physical activities or sports. °? Resume sexual activity. °General instructions °· If the catheter site starts to bleed, raise your arm and put firm pressure on the site. If the bleeding does not stop, get help right away. This is a medical emergency. °· If you went home on the same day as your procedure, a responsible adult should be with you for the first 24 hours after you arrive home. °· Keep all follow-up visits as told by your health care provider. This is important. °Contact a health care provider if: °· You have a fever. °· You have redness, swelling, or yellow drainage around your insertion site. °Get help right away if: °· You have unusual pain at the radial site. °· The catheter insertion area swells very fast. °· The insertion area is bleeding, and the bleeding does not stop when you hold steady pressure on the area. °· Your arm or hand becomes pale, cool, tingly, or numb. °These symptoms may represent a serious problem   that is an emergency. Do not wait to see if the symptoms will go away. Get medical help right away. Call your local emergency services (911 in the [place] Do not drive yourself to the hospital. °Summary °· After the procedure, it is common to have bruising and tenderness at the site. °· Follow instructions from your health care provider about how to take care of your radial site wound. Check the wound every day for signs of infection. °· Do not lift anything that is heavier than 10 lb (4.5 kg), or the limit that you are told, until your health care provider says  that it is safe. °This information is not intended to replace advice given to you by your health care provider. Make sure you discuss any questions you have with your health care provider. °Document Released: 08/21/2010 Document Revised: 08/24/2017 Document Reviewed: 08/24/2017 °Elsevier Patient Education © 2020 [employer] ° °

## 2019-03-16 NOTE — Progress Notes (Signed)
Confirmed with Vin, PA to discontinue Aggranox and start taking Plavix.  Pharmacy delivered Plavix to wife.

## 2019-03-16 NOTE — Progress Notes (Signed)
CARDIAC REHAB PHASE I   Completed stent education with pt. Pt educated on importance of ASA, Plavix, and NTG. Pt given stent card and heart healthy diet. Reviewed restrictions, site care, and exercise guidelines. Will refer to CRP II [facility].   [id number] Rufina Falco, RN BSN 03/16/2019 12:06 PM

## 2019-03-16 NOTE — Progress Notes (Signed)
Vin, Pa in to see pt states ok for him to discharge at 1700

## 2019-03-16 NOTE — Discharge Summary (Signed)
Discharge Summary    Patient ID: Calvin Wilkerson MRN: [id number]; DOB: 05-04-1966  Admit date: 03/16/2019 Discharge date: 03/16/2019  Primary Care Provider: Nicholos Johns, MD  Primary Cardiologist: Jenean Lindau, MD  Discharge Diagnoses    Active Problems:   Abnormal stress test showing ischemia involving inferior wall   CAD   HTN  HLD  Allergies No Known Allergies  Diagnostic Studies/Procedures   CORONARY STENT INTERVENTION  LEFT HEART CATH AND CORONARY ANGIOGRAPHY  Conclusion    Prox RCA lesion is 75% stenosed.  A drug-eluting stent was successfully placed using a STENT SYNERGY DES 4X20.  Post intervention, there is a 0% residual stenosis.  Mid LAD lesion is 40% stenosed.   1. Severe single vessel CAD involving a large, dominant RCA, treated successfully with PCI using a 4.0x20 mm Synergy DES 2. Patent left main, LAD (nonobstructive stenosis), and LCx (diminutive circumflex territory)  Recommend: ASA/Clopidogrel x 6 months minimum. Same day PCI protocol if no complications   Diagnostic Dominance: Right  Intervention     History of Present Illness     Calvin Wilkerson is a 53 y.o. with hx of HTN, COPD, PVD, ongoing tobacco smoking and chron's disease.   Recently dealing with angina. Lexiscan showed ischemia involving inferior wall. Initially declined cath however later considered due to ongoing symptoms.   Hospital Course     Consultants: None  Cath showed severe single vessel CAD involving large dominant RCA s/p PCI with DES using 4.0x77mm Synergy DES. Otherwise non obstructive CAD. DAPT with ASA and Plavix for minimum of 6 months. No complications. Felt stable for same day PCI. Continued home medications. LDL goal less than 70. Consider lipid panel as outpatient.   The patient been seen by Dr. Burt Knack today and deemed ready for discharge home. All follow-up appointments have been scheduled. Discharge medications are listed below.  Discharge Vitals Blood  pressure (!) 121/53, pulse (!) 57, temperature 98 F (36.7 C), temperature source Oral, resp. rate 16, height 6\' 1"  (1.854 m), weight 115.7 kg, SpO2 99 %.  Filed Weights   03/16/19 0807  Weight: 115.7 kg    Physical Exam  Constitutional: He is oriented to person, place, and time and well-developed, well-nourished, and in no distress.  HENT:  Head: Normocephalic and atraumatic.  Eyes: Pupils are equal, round, and reactive to light. Conjunctivae are normal.  Neck: Normal range of motion. Neck supple.  Cardiovascular: Normal rate and regular rhythm.  R radial cath site without hematoma   Pulmonary/Chest: Effort normal and breath sounds normal.  Abdominal: Soft. Bowel sounds are normal.  Musculoskeletal: Normal range of motion.  Neurological: He is alert and oriented to person, place, and time.  Skin: Skin is warm and dry.  Psychiatric: Affect normal.    Labs & Radiologic Studies    COVID negative   Disposition   Pt is being discharged home today in good condition.  Follow-up Plans & Appointments    Follow-up Information    Revankar, Reita Cliche, MD. Go on 04/19/2019.   Specialty: Cardiology Why: @3 :15 for hospital follow up  Contact information: [place] [address] [phone]          Discharge Instructions    Amb Referral to Cardiac Rehabilitation   Complete by: As directed    Will send Cardiac Rehab Phase II referral to [facility]   Diagnosis: Coronary Stents   After initial evaluation and assessments completed: Virtual Based Care may be provided alone or in conjunction with  Phase 2 Cardiac Rehab based on patient barriers.: Yes      Discharge Medications   Allergies as of 03/16/2019   No Known Allergies     Medication List    TAKE these medications   acetaminophen 500 MG tablet Commonly known as: TYLENOL Take 1,000 mg by mouth every 6 (six) hours as needed (pain).   albuterol 108 (90 Base) MCG/ACT inhaler Commonly known as: VENTOLIN  HFA 2 puffs daily.   aspirin EC 81 MG tablet Take 81 mg by mouth daily.   atorvastatin 20 MG tablet Commonly known as: LIPITOR Take 20 mg by mouth daily.   buPROPion 150 MG 24 hr tablet Commonly known as: WELLBUTRIN XL Take 150 mg by mouth daily.   clopidogrel 75 MG tablet Commonly known as: Plavix Take 1 tablet (75 mg total) by mouth daily.   dicyclomine 10 MG capsule Commonly known as: BENTYL Take 1 capsule (10 mg total) by mouth 4 (four) times daily -  before meals and at bedtime.   docusate sodium 100 MG capsule Commonly known as: COLACE Take 100 mg by mouth daily.   fluticasone 50 MCG/ACT nasal spray Commonly known as: FLONASE Place 1 spray into both nostrils daily.   furosemide 20 MG tablet Commonly known as: LASIX Take 40 mg by mouth daily.   gabapentin 300 MG capsule Commonly known as: NEURONTIN Take 300 mg by mouth 2 (two) times daily as needed (pain).   losartan-hydrochlorothiazide 50-12.5 MG tablet Commonly known as: HYZAAR Take 1 tablet by mouth daily.   methimazole 5 MG tablet Commonly known as: TAPAZOLE Take 5 mg by mouth daily.   montelukast 10 MG tablet Commonly known as: SINGULAIR Take 10 mg by mouth at bedtime.   multivitamin with minerals tablet Take 1 tablet by mouth daily.   nitroGLYCERIN 0.4 MG SL tablet Commonly known as: NITROSTAT Place 1 tablet (0.4 mg total) under the tongue every 5 (five) minutes as needed.   pantoprazole 40 MG tablet Commonly known as: PROTONIX Take 1 tablet (40 mg total) by mouth daily.   Vitamin D (Ergocalciferol) 1.25 MG (50000 UT) Caps capsule Commonly known as: DRISDOL Take 50,000 Units by mouth every 7 (seven) days.        Acute coronary syndrome (MI, NSTEMI, STEMI, etc) this admission?:  No.     Outstanding Labs/Studies   Lipid panel   Duration of Discharge Encounter   Greater than 30 minutes including physician time.  Lorelei PontSigned, Graylen Noboa, PA 03/16/2019, 12:53 PM

## 2019-03-16 NOTE — Interval H&P Note (Signed)
Cath Lab Visit (complete for each Cath Lab visit)  Clinical Evaluation Leading to the Procedure:   ACS: No.  Non-ACS:    Anginal Classification: CCS III  Anti-ischemic medical therapy: No Therapy  Non-Invasive Test Results: Intermediate-risk stress test findings: cardiac mortality 1-3%/year  Prior CABG: No previous CABG      History and Physical Interval Note:  03/16/2019 10:08 AM  Calvin Wilkerson  has presented today for surgery, with the diagnosis of Angina.  The various methods of treatment have been discussed with the patient and family. After consideration of risks, benefits and other options for treatment, the patient has consented to  Procedure(s): LEFT HEART CATH AND CORONARY ANGIOGRAPHY (N/A) as a surgical intervention.  The patient's history has been reviewed, patient examined, no change in status, stable for surgery.  I have reviewed the patient's chart and labs.  Questions were answered to the patient's satisfaction.     Sherren Mocha

## 2019-03-16 NOTE — Progress Notes (Signed)
Ambulated to bathroom tol well  

## 2019-03-19 ENCOUNTER — Encounter (HOSPITAL_COMMUNITY): Payer: Self-pay | Admitting: Cardiovascular Disease

## 2019-04-17 DIAGNOSIS — E0500 Thyrotoxicosis with diffuse goiter without thyrotoxic crisis or storm: Secondary | ICD-10-CM | POA: Diagnosis not present

## 2019-04-17 DIAGNOSIS — Z1331 Encounter for screening for depression: Secondary | ICD-10-CM | POA: Diagnosis not present

## 2019-04-17 DIAGNOSIS — E782 Mixed hyperlipidemia: Secondary | ICD-10-CM | POA: Diagnosis not present

## 2019-04-17 DIAGNOSIS — Z79899 Other long term (current) drug therapy: Secondary | ICD-10-CM | POA: Diagnosis not present

## 2019-04-17 DIAGNOSIS — Z8679 Personal history of other diseases of the circulatory system: Secondary | ICD-10-CM | POA: Diagnosis not present

## 2019-04-17 DIAGNOSIS — I10 Essential (primary) hypertension: Secondary | ICD-10-CM | POA: Diagnosis not present

## 2019-04-18 ENCOUNTER — Telehealth: Payer: Self-pay | Admitting: Gastroenterology

## 2019-04-18 NOTE — Telephone Encounter (Signed)
Please review patient message and advise 

## 2019-04-19 ENCOUNTER — Encounter: Payer: Self-pay | Admitting: Cardiology

## 2019-04-19 ENCOUNTER — Other Ambulatory Visit: Payer: Self-pay

## 2019-04-19 ENCOUNTER — Ambulatory Visit (INDEPENDENT_AMBULATORY_CARE_PROVIDER_SITE_OTHER): Payer: BC Managed Care – PPO | Admitting: Cardiology

## 2019-04-19 VITALS — BP 142/76 | HR 65 | Ht 74.0 in | Wt 267.8 lb

## 2019-04-19 DIAGNOSIS — E782 Mixed hyperlipidemia: Secondary | ICD-10-CM

## 2019-04-19 DIAGNOSIS — I10 Essential (primary) hypertension: Secondary | ICD-10-CM

## 2019-04-19 NOTE — Patient Instructions (Signed)

## 2019-04-19 NOTE — Progress Notes (Signed)
Cardiology Office Note:    Date:  04/19/2019   ID:  Calvin Wilkerson, DOB 11-21-1965, MRN [id number]  PCP:  Lucianne Lei, MD  Cardiologist:  Garwin Brothers, MD   Referring MD: Lucianne Lei, MD    ASSESSMENT:    1. HTN (hypertension), benign   2. Mixed hyperlipidemia    PLAN:    In order of problems listed above:  1. Coronary artery disease: Secondary prevention stressed with the patient.  Importance of compliance with diet and medication stressed and he vocalized understanding. 2. Essential hypertension: Blood pressure stable 3. Mixed dyslipidemia: Diet was discussed and statin therapy was explained 4. Obesity: Weight reduction was stressed and diet was emphasized.  Importance of walking 2 miles a day at least 5 days a week was emphasized and he promises to do so. 5. Cigarette smoker: I spent 5 minutes with the patient discussing solely about smoking. Smoking cessation was counseled. I suggested to the patient also different medications and pharmacological interventions. Patient is keen to try stopping on its own at this time. He will get back to me if he needs any further assistance in this matter. 6. Patient will be seen in follow-up appointment in 4 months or earlier if the patient has any concerns    Medication Adjustments/Labs and Tests Ordered: Current medicines are reviewed at length with the patient today.  Concerns regarding medicines are outlined above.  No orders of the defined types were placed in this encounter.  No orders of the defined types were placed in this encounter.    No chief complaint on file.    History of Present Illness:    Calvin Wilkerson is a 53 y.o. male.  Patient has history of essential hypertension and smoking.  His stress test was abnormal and this was done for anginal pain.  His coronary angiography revealed significant obstructive stenosis of the right coronary artery which was stented and a 40% stenosis in the mid LAD.  Subsequently is done  fine.  He is cutting down on smoking but has not eliminated it completely.  At the time of my evaluation, the patient is alert awake oriented and in no distress.  He is walking on a regular basis.  Past Medical History:  Diagnosis Date  . Aortic atherosclerosis (HCC)   . COPD (chronic obstructive pulmonary disease) (HCC)   . Crohn's disease (HCC)   . Erectile dysfunction   . GERD (gastroesophageal reflux disease)   . GI bleed   . History of diverticulitis   . History of recurrent TIAs   . Hypertension   . Hyperthyroidism   . Mood disorder (HCC)   . PVD (peripheral vascular disease) (HCC)   . Status post laparoscopy 12/2016   removed mesh from previous hernia repair that was causing a strangulated small intestine.  . Vitamin D deficiency     Past Surgical History:  Procedure Laterality Date  . APPENDECTOMY    . COLECTOMY    . COLONOSCOPY  01/2016   Colonic polyp status post polypectomy. Few erosions in the TI consistent with history of Crohns Disease. Mild pancolonic diverticulosis. Internal hemorrhoids.   . COLOSTOMY REVERSAL    . CORONARY STENT INTERVENTION N/A 03/16/2019   Procedure: CORONARY STENT INTERVENTION;  Surgeon: Tonny Bollman, MD;  Location: [facility] INVASIVE CV LAB;  Service: Cardiovascular;  Laterality: N/A;  . [place]    . HERNIA REPAIR    . LEFT HEART CATH AND CORONARY ANGIOGRAPHY N/A 03/16/2019   Procedure: LEFT HEART  CATH AND CORONARY ANGIOGRAPHY;  Surgeon: Tonny Bollmanooper, Michael, MD;  [facility];  Service: Cardiovascular;  Laterality: N/A;  . UPPER GI ENDOSCOPY  2011   Healed esophageal erosions. Mild gastritis.     Current Medications: Current Meds  Medication Sig  . acetaminophen (TYLENOL) 500 MG tablet Take 1,000 mg by mouth every 6 (six) hours as needed (pain).   Marland Kitchen. albuterol (VENTOLIN HFA) 108 (90 Base) MCG/ACT inhaler 2 puffs daily.   Marland Kitchen. aspirin EC 81 MG tablet Take 81 mg by mouth daily.  Marland Kitchen. atorvastatin (LIPITOR) 20 MG tablet Take 20 mg by  mouth daily.  Marland Kitchen. buPROPion (WELLBUTRIN XL) 150 MG 24 hr tablet Take 150 mg by mouth daily.  . clopidogrel (PLAVIX) 75 MG tablet Take 1 tablet (75 mg total) by mouth daily.  Marland Kitchen. dicyclomine (BENTYL) 10 MG capsule Take 1 capsule (10 mg total) by mouth 4 (four) times daily -  before meals and at bedtime.  . docusate sodium (COLACE) 100 MG capsule Take 100 mg by mouth daily.   . fluticasone (FLONASE) 50 MCG/ACT nasal spray Place 1 spray into both nostrils daily.   . furosemide (LASIX) 20 MG tablet Take 40 mg by mouth daily.   Marland Kitchen. gabapentin (NEURONTIN) 300 MG capsule Take 300 mg by mouth 2 (two) times daily as needed (pain).   Marland Kitchen. losartan-hydrochlorothiazide (HYZAAR) 50-12.5 MG tablet Take 1 tablet by mouth daily.  . methimazole (TAPAZOLE) 5 MG tablet Take 5 mg by mouth daily.  . montelukast (SINGULAIR) 10 MG tablet Take 10 mg by mouth at bedtime.  . Multiple Vitamins-Minerals (MULTIVITAMIN WITH MINERALS) tablet Take 1 tablet by mouth daily.  . pantoprazole (PROTONIX) 40 MG tablet Take 1 tablet (40 mg total) by mouth daily.  . Vitamin D, Ergocalciferol, (DRISDOL) 50000 units CAPS capsule Take 50,000 Units by mouth every 7 (seven) days.     Allergies:   Patient has no known allergies.   Social History   Socioeconomic History  . Marital status: Married    Spouse name: Not on file  . Number of children: Not on file  . Years of education: Not on file  . Highest education level: Not on file  Occupational History  . Not on file  Social Needs  . Financial resource strain: Not on file  . Food insecurity    Worry: Not on file    Inability: Not on file  . Transportation needs    Medical: Not on file    Non-medical: Not on file  Tobacco Use  . Smoking status: Current Every Day Smoker    Packs/day: 1.50  . Smokeless tobacco: Never Used  Substance and Sexual Activity  . Alcohol use: Not Currently  . Drug use: Never  . Sexual activity: Not on file  Lifestyle  . Physical activity    Days per  week: Not on file    Minutes per session: Not on file  . Stress: Not on file  Relationships  . Social Musicianconnections    Talks on phone: Not on file    Gets together: Not on file    Attends religious service: Not on file    Active member of club or organization: Not on file    Attends meetings of clubs or organizations: Not on file    Relationship status: Not on file  Other Topics Concern  . Not on file  Social History Narrative  . Not on file     Family History: The patient's family history includes  Bone cancer in his brother and maternal grandmother; Breast cancer in his sister; Hypertension in his mother; Hypothyroidism in his mother.  ROS:   Please see the history of present illness.    All other systems reviewed and are negative.  EKGs/Labs/Other Studies Reviewed:    The following studies were reviewed today: Study date: 03/16/19  Physicians  Panel Physicians Referring Physician Case Authorizing Physician  Sherren Mocha, MD (Primary)    Procedures  CORONARY STENT INTERVENTION  LEFT HEART CATH AND CORONARY ANGIOGRAPHY  Conclusion    Prox RCA lesion is 75% stenosed.  A drug-eluting stent was successfully placed using a STENT SYNERGY DES 4X20.  Post intervention, there is a 0% residual stenosis.  Mid LAD lesion is 40% stenosed.   1. Severe single vessel CAD involving a large, dominant RCA, treated successfully with PCI using a 4.0x20 mm Synergy DES 2. Patent left main, LAD (nonobstructive stenosis), and LCx (diminutive circumflex territory)  Recommend: ASA/Clopidogrel x 6 months minimum. Same day PCI protocol if no complications       Recent Labs: 03/08/2019: ALT 18; BUN 19; Creatinine, Ser 1.14; Hemoglobin 15.3; Magnesium 1.9; Platelets 213.0; Potassium 3.9; Sodium 137  Recent Lipid Panel No results found for: CHOL, TRIG, HDL, CHOLHDL, VLDL, LDLCALC, LDLDIRECT  Physical Exam:    VS:  BP (!) 142/76 (BP Location: Left Arm, Patient Position: Sitting, Cuff  Size: Normal)   Pulse 65   Ht 6\' 2"  (1.88 m)   Wt 267 lb 12.8 oz (121.5 kg)   SpO2 97%   BMI 34.38 kg/m     Wt Readings from Last 3 Encounters:  04/19/19 267 lb 12.8 oz (121.5 kg)  03/16/19 255 lb (115.7 kg)  03/09/19 251 lb 9.6 oz (114.1 kg)     GEN: Patient is in no acute distress HEENT: Normal NECK: No JVD; No carotid bruits LYMPHATICS: No lymphadenopathy CARDIAC: Hear sounds regular, 2/6 systolic murmur at the apex. RESPIRATORY:  Clear to auscultation without rales, wheezing or rhonchi  ABDOMEN: Soft, non-tender, non-distended MUSCULOSKELETAL:  No edema; No deformity  SKIN: Warm and dry NEUROLOGIC:  Alert and oriented x 3 PSYCHIATRIC:  Normal affect   Signed, Jenean Lindau, MD  04/19/2019 3:37 PM    [facility]

## 2019-04-20 ENCOUNTER — Other Ambulatory Visit: Payer: Self-pay

## 2019-04-20 ENCOUNTER — Telehealth: Payer: Self-pay | Admitting: Gastroenterology

## 2019-04-20 DIAGNOSIS — K219 Gastro-esophageal reflux disease without esophagitis: Secondary | ICD-10-CM

## 2019-04-20 MED ORDER — RABEPRAZOLE SODIUM 20 MG PO TBEC: 20.0000 mg | DELAYED_RELEASE_TABLET | Freq: Every day | ORAL | 1 refills | Status: DC

## 2019-04-20 NOTE — Telephone Encounter (Signed)
Called and spoke with patient- patient reports he will try the recommendations from Dr. Lyndel Safe; patient has requested to be seen again by Dr. Lyndel Safe so that he can find out more information concerning the large ventral hernia and to let Dr. Lyndel Safe know that the only way "anyone will operate on me for this hernia is if it is an emergency"; patient is requesting appt to also be scheduled so he can find out about the EGD/colon with need of cardiac clearance prior to the procedure-patient is currently on Plavix -patient has been scheduled for an in office appt with Dr. Lyndel Safe on 05/02/2019 at 11:20am to receive information and to be scheduled for EGD/colon-may need to be done at [facility] due to comorbidities;  Dr. Dietrich Pates. Revankar has seen this patient-cardiac clearance still need when scheduled for EGD/colon-does this need to be done at [facility]?

## 2019-04-20 NOTE — Telephone Encounter (Signed)
Does not need to be out of work for chronic abdominal pain. Plan is - Heating pads for musculoskeletal component of abdominal pain. -Once cardiac WU is complete, needs EGD and colon.  I have explained risks and benefits. -See last note.  RG

## 2019-04-20 NOTE — Telephone Encounter (Signed)
Called and spoke with patient-patient informed of result note and MD recommendations; patient is agreeable with plan of care, RX sent in to pharmacy of patient choice; Patient verbalized understanding of information/instructions;  Patient was advised to call the office at [phone] if questions/concerns arise;

## 2019-04-20 NOTE — Telephone Encounter (Signed)
Please advise on alternative medication as Protonix is not working;

## 2019-04-20 NOTE — Telephone Encounter (Signed)
Pt states that protonix is not working and would like something else.

## 2019-04-20 NOTE — Telephone Encounter (Signed)
Lets try AcipHex 20 mg p.o. once a day, 30, with 6 refills Let us know how he does in 2 to 3 weeks  RG

## 2019-04-22 NOTE — Telephone Encounter (Signed)
Sure- can FU About [facility] -I do not see any contraindication. Still, lets run it by Osvaldo Angst to be sure.  Thx  RG

## 2019-04-23 NOTE — Telephone Encounter (Signed)
Brianna, ° °This pt is cleared for anesthetic care at [facility]. ° °Thanks, ° °Tiras Bianchini °

## 2019-04-23 NOTE — Telephone Encounter (Signed)
John-Will you please let the office know if this patient is appropriate for [place] EGD/colon? Thank you

## 2019-05-02 ENCOUNTER — Ambulatory Visit (INDEPENDENT_AMBULATORY_CARE_PROVIDER_SITE_OTHER): Payer: BC Managed Care – PPO | Admitting: Gastroenterology

## 2019-05-02 ENCOUNTER — Other Ambulatory Visit (INDEPENDENT_AMBULATORY_CARE_PROVIDER_SITE_OTHER): Payer: BC Managed Care – PPO

## 2019-05-02 ENCOUNTER — Telehealth: Payer: Self-pay

## 2019-05-02 ENCOUNTER — Other Ambulatory Visit: Payer: Self-pay

## 2019-05-02 ENCOUNTER — Encounter: Payer: Self-pay | Admitting: Gastroenterology

## 2019-05-02 VITALS — BP 128/78 | HR 72 | Temp 98.2°F | Ht 74.0 in | Wt 262.5 lb

## 2019-05-02 DIAGNOSIS — R109 Unspecified abdominal pain: Secondary | ICD-10-CM | POA: Diagnosis not present

## 2019-05-02 DIAGNOSIS — K219 Gastro-esophageal reflux disease without esophagitis: Secondary | ICD-10-CM

## 2019-05-02 DIAGNOSIS — G8929 Other chronic pain: Secondary | ICD-10-CM | POA: Diagnosis not present

## 2019-05-02 LAB — COMPREHENSIVE METABOLIC PANEL
ALT: 20 U/L (ref 0–53)
AST: 17 U/L (ref 0–37)
Albumin: 4.4 g/dL (ref 3.5–5.2)
Alkaline Phosphatase: 64 U/L (ref 39–117)
BUN: 9 mg/dL (ref 6–23)
CO2: 25 mEq/L (ref 19–32)
Calcium: 9.7 mg/dL (ref 8.4–10.5)
Chloride: 103 mEq/L (ref 96–112)
Creatinine, Ser: 0.97 mg/dL (ref 0.40–1.50)
GFR: 80.27 mL/min (ref >60.00)
Glucose, Bld: 126 mg/dL — ABNORMAL HIGH (ref 70–99)
Potassium: 3.7 mEq/L (ref 3.5–5.1)
Sodium: 139 mEq/L (ref 135–145)
Total Bilirubin: 0.5 mg/dL (ref 0.2–1.2)
Total Protein: 7.8 g/dL (ref 6.0–8.3)

## 2019-05-02 NOTE — Progress Notes (Signed)
Chief Complaint:  FU Abdominal pain  Referring Provider:  Dr Mathis Bud      ASSESSMENT AND PLAN;   #1. CAD s/p stenting 03/16/2019 on plavix /ASA. Being followed by Dr. Tomie China.  #2. GERD.   #3. Chronic abdominal pain - likely abdominal wall pain, neg CT AP 12/01/2017 for any SBO.  D/t multiple abdominal surgeries he is expected to have adhesions.  #4.  Likely Mild Crohn's disease involving TI. Dx on Colon 01/2016-few erosions in the terminal ileum, mild activity. Bx- neg.  No meds. Asymptomatic.  #2. Large vantral hernia s/p repair complicated by PBSO requiring laparoscopy Jan 2018 at [facility].  Plan: -Pt wants long term disability for chronic abdominal pain.  This is just 1 of many problems he has.  Based solely on abdominal pain, not sure if you would qualify for disability. Can fill forms.  I have told him that final disability decision rests on physicians in [place].  They are very independent. We will be more than happy to give them all the records. -CT AP with PO/IV contrast. -Change omeprazole to protonix 40mg  po qd. -Continue Bently 10mg  po qid #120  -Heating pads for musculoskeletal component of abdominal pain. -EGD and colon after cardio clearence, need to hold plavix 5 days, can continue ASA throughout.  I have explained risks and benefits. -FU in 12 weeks, earlier in case of any problems.   HPI:   53 year old   For follow-up visit.  With chest pains, recent abnormal stress test, underwent cardiac catheterization which showed RCA stenosis S/P RCA stenting 03/16/2019.  It also showed nonobstructive LAD lesion.  He has been started on aspirin and Plavix.   With continued acute on chronic abdominal pain with abdominal distention.  Denies having any nausea/vomiting.  Wants disability from work.  His abdominal pain gets worse when he bends.  "Feels like the whole abdomen folds on itself".  He has been told by the surgeon that he should not get any abdominal surgeries unless it  is an emergency.  Has nausea with occasional heartburn despite omeprazole.  No odynophagia or dysphagia.  He has history of diarrhea especially a at the frequency of 2-3 bowel movements per day without any nocturnal symptoms.   Past GI procedures: - CT A/P 12/01/2017 with p.o. and IV contrast-no acute abnormality, no small bowel obstruction, no abscesses, aortic atherosclerosis. -Colonoscopy 02/09/2016 ([facility])-colonic polyp status post polypectomy, few erosions in TI consistent with history of Crohn's disease.  Mild activity.  Mild pancolonic diverticulosis, sigmoid resection, internal hemorrhoids. -EGD 11/05/2009-healed distal esophageal erosions.  Mild gastritis.  Past Medical History:  Diagnosis Date      . GI bleed   . Hypertension   . Hyperthyroidism   . Status post laparoscopy 12/2016   removed mesh from previous hernia repair that was causing a strangulated small intestine.    Past Surgical History:  Procedure Laterality Date  . APPENDECTOMY    . COLECTOMY    . COLONOSCOPY  01/2016   Colonic polyp status post polypectomy. Few erosions in the TI consistent with history of Crohns Disease. Mild pancolonic diverticulosis. Internal hemorrhoids.   . COLOSTOMY REVERSAL    . CORONARY STENT INTERVENTION N/A 03/16/2019   Procedure: CORONARY STENT INTERVENTION;  Surgeon: 03/2016, MD;  Location: [facility] INVASIVE CV LAB;  Service: Cardiovascular;  Laterality: N/A;  . [place]mann[place]    . HERNIA REPAIR    . LEFT HEART CATH AND CORONARY ANGIOGRAPHY N/A 03/16/2019   Procedure: LEFT HEART CATH AND  CORONARY ANGIOGRAPHY;  Surgeon: Tonny Bollman, MD;  [facility] CV LAB;  Service: Cardiovascular;  Laterality: N/A;  . UPPER GI ENDOSCOPY  2011   Healed esophageal erosions. Mild gastritis.     Family History  Problem Relation Age of Onset  . Hypothyroidism Mother   . Hypertension Mother   . Breast cancer Sister   . Bone cancer Brother   . Bone cancer Maternal Grandmother     Social  History   Tobacco Use  . Smoking status: Current Every Day Smoker    Packs/day: 1.50  . Smokeless tobacco: Never Used  Substance Use Topics  . Alcohol use: Not Currently  . Drug use: Never    Current Outpatient Medications  Medication Sig Dispense Refill  . acetaminophen (TYLENOL) 500 MG tablet Take 1,000 mg by mouth every 6 (six) hours as needed (pain).     Marland Kitchen albuterol (VENTOLIN HFA) 108 (90 Base) MCG/ACT inhaler 2 puffs daily.     Marland Kitchen aspirin EC 81 MG tablet Take 81 mg by mouth daily.    Marland Kitchen atorvastatin (LIPITOR) 20 MG tablet Take 20 mg by mouth daily.    Marland Kitchen buPROPion (WELLBUTRIN XL) 150 MG 24 hr tablet Take 150 mg by mouth daily.    . clopidogrel (PLAVIX) 75 MG tablet Take 1 tablet (75 mg total) by mouth daily. 30 tablet 11  . dicyclomine (BENTYL) 10 MG capsule Take 1 capsule (10 mg total) by mouth 4 (four) times daily -  before meals and at bedtime. 120 capsule 0  . docusate sodium (COLACE) 100 MG capsule Take 100 mg by mouth daily.     . fluticasone (FLONASE) 50 MCG/ACT nasal spray Place 1 spray into both nostrils daily.     . furosemide (LASIX) 20 MG tablet Take 40 mg by mouth daily.     Marland Kitchen gabapentin (NEURONTIN) 300 MG capsule Take 300 mg by mouth 2 (two) times daily as needed (pain).     Marland Kitchen losartan-hydrochlorothiazide (HYZAAR) 50-12.5 MG tablet Take 1 tablet by mouth daily.    . methimazole (TAPAZOLE) 5 MG tablet Take 5 mg by mouth daily.    . montelukast (SINGULAIR) 10 MG tablet Take 10 mg by mouth at bedtime.    . Multiple Vitamins-Minerals (MULTIVITAMIN WITH MINERALS) tablet Take 1 tablet by mouth daily.    . RABEprazole (ACIPHEX) 20 MG tablet Take 1 tablet (20 mg total) by mouth daily. 90 tablet 1  . Vitamin D, Ergocalciferol, (DRISDOL) 50000 units CAPS capsule Take 50,000 Units by mouth every 7 (seven) days.    . nitroGLYCERIN (NITROSTAT) 0.4 MG SL tablet Place 1 tablet (0.4 mg total) under the tongue every 5 (five) minutes as needed. 25 tablet 3  . pantoprazole (PROTONIX) 40  MG tablet Take 1 tablet (40 mg total) by mouth daily. (Patient not taking: Reported on 05/02/2019) 30 tablet 6   No current facility-administered medications for this visit.     No Known Allergies  Review of Systems:  neg     Physical Exam:    BP 128/78   Pulse 72   Temp 98.2 F (36.8 C)   Ht 6\' 2"  (1.88 m)   Wt 262 lb 8 oz (119.1 kg)   BMI 33.70 kg/m  Filed Weights   05/02/19 1121  Weight: 262 lb 8 oz (119.1 kg)   Constitutional:  Well-developed, in no acute distress. Psychiatric: Normal mood and affect. Behavior is normal. HEENT: Pupils normal.  Conjunctivae are normal. No scleral icterus. Neck supple.  Cardiovascular: Normal rate, regular rhythm. No edema Pulmonary/chest: Effort normal and breath sounds normal. No wheezing, rales or rhonchi. Abdominal: Soft, nondistended.  Generalized abdominal tenderness. bowel sounds active throughout. There are no masses palpable. No hepatomegaly.  Multiple well-healed scars  Rectal:  defered Neurological: Alert and oriented to person place and time. Skin: Skin is warm and dry. No rashes noted.  Data Reviewed:   CT scan of the abdomen and pelvis 12/01/2017 with p.o. and IV contrast-no acute abnormality, no small bowel obstruction, no abscesses, aortic atherosclerosis. 25 minutes spent with the patient today. Greater than 50% was spent in counseling and coordination of care with the patient   Carmell Austria, MD   Cc: Dr Rica Records

## 2019-05-02 NOTE — Telephone Encounter (Signed)
Dr. Geraldo Pitter, Request received to hold plavix for 5 days for colonoscopy in this patient that just had DES to RCA on 03/16/19 - not an ACS presentation. Heart cath was in response to an abnormal stress test.  I was not able to reach the pt to determine the urgency of his EGD/colonoscopy.  Can you please provide recommendations regarding when he may hold plavix for a colonoscopy?

## 2019-05-02 NOTE — Telephone Encounter (Signed)
Calvin Wilkerson, The pt has undergone DES just a little over a month ago. At least 6 months of dual antiplatelet therapy is mandatory and then this decision will have to be reviewed.If any different needs to be done (based on the urgency of the situation) benefit risk analysis must be discussed with patient. Not being on two antiplatelet meds pits the pt at a high risk of stent thrombosis as we are all aware. Thanks. Dr Geraldo Pitter

## 2019-05-02 NOTE — Patient Instructions (Signed)
If you are age 55 or older, your body mass index should be between 23-30. Your Body mass index is 33.7 kg/m. If this is out of the aforementioned range listed, please consider follow up with your Primary Care Provider.  If you are age 52 or younger, your body mass index should be between 19-25. Your Body mass index is 33.7 kg/m. If this is out of the aformentioned range listed, please consider follow up with your Primary Care Provider.   You have been [facility] [place] [address] [address]   You are scheduled on 05/11/19 at [place] should arrive 15 minutes prior to your appointment time for registration. Please follow the written instructions below on the day of your exam:  WARNING: IF YOU ARE ALLERGIC TO IODINE/X-RAY DYE, PLEASE NOTIFY RADIOLOGY IMMEDIATELY AT [phone]! YOU WILL BE GIVEN A 13 HOUR PREMEDICATION PREP.  1) Do not eat or drink anything after 6am (4 hours prior to your test) 2) You have been given 2 bottles of oral contrast to drink. The solution may taste better if refrigerated, but do NOT add ice or any other liquid to this solution. Shake well before drinking.    Drink 1 bottle of contrast @ 8am (2 hours prior to your exam)  Drink 1 bottle of contrast @ 9am (1 hour prior to your exam)  You may take any medications as prescribed with a small amount of water, if necessary. If you take any of the following medications: METFORMIN, GLUCOPHAGE, GLUCOVANCE, AVANDAMET, RIOMET, FORTAMET, [place] MET, JANUMET, GLUMETZA or METAGLIP, you MAY be asked to HOLD this medication 48 hours AFTER the exam.  The purpose of you drinking the oral contrast is to aid in the visualization of your intestinal tract. The contrast solution may cause some diarrhea. Depending on your individual set of symptoms, you may also receive an intravenous injection of x-ray contrast/dye. Plan on being at [facility] for 30 minutes or longer, depending on the type of exam you are having performed.  This test typically takes 30-45 minutes to complete.  If you have any questions regarding your exam or if you need to reschedule, you may call the CT department at [phone] between the hours of 8:00 am and 5:00 pm, Monday-Friday.  ________________________________________________________________________  We have sent the following medications to your pharmacy for you to pick up at your convenience: Protonix 40 mg once daily.   Continue Bentyl  Use heating pad.  You have been scheduled for an endoscopy and colonoscopy. Please follow the written instructions given to you at your visit today. Please pick up your prep supplies at the pharmacy within the next 1-3 days. If you use inhalers (even only as needed), please bring them with you on the day of your procedure. Your physician has requested that you go to www.startemmi.com and enter the access code given to you at your visit today. This web site gives a general overview about your procedure. However, you should still follow specific instructions given to you by our office regarding your preparation for the procedure.  You will be contacted by our office prior to your procedure for directions on holding your Plavix.  If you do not hear from our office 1 week prior to your scheduled procedure, please call [phone] to discuss.    Please go to the lab at [facility] ([place] You will need  to go to level "B", you do not need an appointment for this. Hours available are 7:30 am - 4:30 pm. You need to have this blood work done before your scheduled appointment for your CT Scan.    Thank you,  Dr. Jackquline Denmark

## 2019-05-02 NOTE — Telephone Encounter (Signed)
[facility] Pre-operative Risk Assessment     Request for surgical clearance:     Endoscopy Procedure  What type of surgery is being performed?     EGD/Colonoscopy  When is this surgery scheduled?     06/11/19  What type of clearance is required ?   Pharmacy  Are there any medications that need to be held prior to surgery and how long? Plavix 5 days  Practice name and name of physician performing surgery?      [facility]  What is your office phone and fax number?      Phone- [phone]  [phone]  Anesthesia type (None, local, MAC, general) ?       MAC

## 2019-05-03 NOTE — Telephone Encounter (Signed)
   Primary Cardiologist: Jenean Lindau, MD  Chart reviewed as part of pre-operative protocol coverage. Request received for guidance on holding plavix for colonoscopy. He had a heart cath with DES to RCA on 03/16/19. He should not hold ASA and plavix for at least 6 months.   If colonoscopy is of some urgency/semi-emergent, we will need to discuss risk and benefits as holding DAPT would place the patient at greater risk for stent thrombosis.  I will route this recommendation to the requesting party via Epic fax function and remove from pre-op pool.  Please call with questions.  [place], PA 05/03/2019, 8:47 AM

## 2019-05-03 NOTE — Telephone Encounter (Signed)
Please see below note from Dr. Geraldo Pitter and let me know how you would like to proceed. Patient is currently scheduled for an EGD/Colon on 06/11/19.

## 2019-05-11 ENCOUNTER — Ambulatory Visit (HOSPITAL_BASED_OUTPATIENT_CLINIC_OR_DEPARTMENT_OTHER)
Admission: RE | Admit: 2019-05-11 | Discharge: 2019-05-11 | Disposition: A | Discharge status: Home / Self Care | Payer: BC Managed Care – PPO | Source: Ambulatory Visit | Attending: Gastroenterology | Admitting: Gastroenterology

## 2019-05-11 ENCOUNTER — Other Ambulatory Visit: Payer: Self-pay

## 2019-05-11 DIAGNOSIS — G8929 Other chronic pain: Secondary | ICD-10-CM | POA: Diagnosis not present

## 2019-05-11 DIAGNOSIS — R109 Unspecified abdominal pain: Secondary | ICD-10-CM | POA: Diagnosis not present

## 2019-05-11 DIAGNOSIS — K219 Gastro-esophageal reflux disease without esophagitis: Secondary | ICD-10-CM | POA: Diagnosis not present

## 2019-05-11 MED ORDER — IOHEXOL 300 MG/ML  SOLN
100.0000 mL | Freq: Once | INTRAMUSCULAR | Status: AC | PRN
  Administered 2019-05-11: 11:00:00 100 mL via INTRAVENOUS

## 2019-05-18 NOTE — Telephone Encounter (Signed)
Previous cardiology communication note reviewed   so clearly, we should not do any elective EGD/colonoscopy at the present time as per cardiology note.  He cannot be taken off DAPT x 6 months after stenting.  Plan: -FU in Feb 2021 (that will be about 6 months after stenting) -Certainly he needs to let us know if he starts having any active bleeding. -We will be on standby  RG

## 2019-05-18 NOTE — Telephone Encounter (Signed)
Called and spoke with patient-patient informed of MD recommendations; patient is agreeable with plan of care; Patient verbalized understanding of information/instructions;  Patient was advised to call the office at [phone] if questions/concerns arise; recall is in Epic for EGD/colon;

## 2019-06-11 ENCOUNTER — Encounter: Payer: BC Managed Care – PPO | Admitting: Gastroenterology

## 2019-06-14 ENCOUNTER — Other Ambulatory Visit: Payer: Self-pay | Admitting: Gastroenterology

## 2019-07-06 ENCOUNTER — Encounter: Payer: Self-pay | Admitting: Cardiology

## 2019-07-06 ENCOUNTER — Other Ambulatory Visit: Payer: Self-pay

## 2019-07-06 ENCOUNTER — Ambulatory Visit (INDEPENDENT_AMBULATORY_CARE_PROVIDER_SITE_OTHER): Payer: BC Managed Care – PPO | Admitting: Cardiology

## 2019-07-06 VITALS — BP 130/82 | HR 70 | Ht 74.0 in | Wt 265.0 lb

## 2019-07-06 DIAGNOSIS — I2510 Atherosclerotic heart disease of native coronary artery without angina pectoris: Secondary | ICD-10-CM | POA: Diagnosis not present

## 2019-07-06 DIAGNOSIS — R079 Chest pain, unspecified: Secondary | ICD-10-CM

## 2019-07-06 DIAGNOSIS — F17210 Nicotine dependence, cigarettes, uncomplicated: Secondary | ICD-10-CM

## 2019-07-06 DIAGNOSIS — Z1329 Encounter for screening for other suspected endocrine disorder: Secondary | ICD-10-CM

## 2019-07-06 DIAGNOSIS — E782 Mixed hyperlipidemia: Secondary | ICD-10-CM | POA: Diagnosis not present

## 2019-07-06 DIAGNOSIS — I10 Essential (primary) hypertension: Secondary | ICD-10-CM

## 2019-07-06 HISTORY — DX: Atherosclerotic heart disease of native coronary artery without angina pectoris: I25.10

## 2019-07-06 NOTE — Patient Instructions (Signed)
Medication Instructions:  Your physician recommends that you continue on your current medications as directed. Please refer to the Current Medication list given to you today.  *If you need a refill on your cardiac medications before your next appointment, please call your pharmacy*  Lab Work: Your physician recommends that you have a BMP, CBC, TSH,hepatic and lipid drawn  If you have labs (blood work) drawn today and your tests are completely normal, you will receive your results only by: Marland Kitchen MyChart Message (if you have MyChart) OR . A paper copy in the mail If you have any lab test that is abnormal or we need to change your treatment, we will call you to review the results.  Testing/Procedures: Your physician has requested that you have a lexiscan myoview. For further information please visit [email or web address] Please follow instruction sheet, as given.    Follow-Up: At [facility], you and your health needs are our priority.  As part of our continuing mission to provide you with exceptional heart care, we have created designated Provider Care Teams.  These Care Teams include your primary Cardiologist (physician) and Advanced Practice Providers (APPs -  Physician Assistants and Nurse Practitioners) who all work together to provide you with the care you need, when you need it.  Your next appointment:   1 month(s)  The format for your next appointment:   In Person  Provider:   Belva Crome, MD  Other Instructions Regadenoson injection What is this medicine? REGADENOSON is used to test the heart for coronary artery disease. It is used in patients who can not exercise for their stress test. This medicine may be used for other purposes; ask your health care provider or pharmacist if you have questions. COMMON BRAND NAME(S): Lexiscan What should I tell my health care provider before I take this medicine? They need to know if you have any of these conditions:  heart problems   lung or breathing disease, like asthma or COPD  an unusual or allergic reaction to regadenoson, other medicines, foods, dyes, or preservatives  pregnant or trying to get pregnant  breast-feeding How should I use this medicine? This medicine is for injection into a vein. It is given by a health care professional in a hospital or clinic setting. Talk to your pediatrician regarding the use of this medicine in children. Special care may be needed. Overdosage: If you think you have taken too much of this medicine contact a poison control center or emergency room at once. NOTE: This medicine is only for you. Do not share this medicine with others. What if I miss a dose? This does not apply. What may interact with this medicine?  caffeine  dipyridamole  guarana  theophylline This list may not describe all possible interactions. Give your health care provider a list of all the medicines, herbs, non-prescription drugs, or dietary supplements you use. Also tell them if you smoke, drink alcohol, or use illegal drugs. Some items may interact with your medicine. What should I watch for while using this medicine? Your condition will be monitored carefully while you are receiving this medicine. Do not take medicines, foods, or drinks with caffeine (like coffee, tea, or colas) for at least 12 hours before your test. If you do not know if something contains caffeine, ask your health care professional. What side effects may I notice from receiving this medicine? Side effects that you should report to your doctor or health care professional as soon as possible:  allergic reactions like skin  rash, itching or hives, swelling of the face, lips, or tongue  breathing problems  chest pain, tightness or palpitations  severe headache Side effects that usually do not require medical attention (report to your doctor or health care professional if they continue or are bothersome):  flushing  headache   irritation or pain at site where injected  nausea, vomiting This list may not describe all possible side effects. Call your doctor for medical advice about side effects. You may report side effects to FDA at 1-800-FDA-1088. Where should I keep my medicine? This drug is given in a hospital or clinic and will not be stored at home. NOTE: This sheet is a summary. It may not cover all possible information. If you have questions about this medicine, talk to your doctor, pharmacist, or health care provider.  2020 Elsevier/Gold Standard (2008-03-18 15:08:13)   Cardiac Nuclear Scan A cardiac nuclear scan is a test that is done to check the flow of blood to your heart. It is done when you are resting and when you are exercising. The test looks for problems such as:  Not enough blood reaching a portion of the heart.  The heart muscle not working as it should. You may need this test if:  You have heart disease.  You have had lab results that are not normal.  You have had heart surgery or a balloon procedure to open up blocked arteries (angioplasty).  You have chest pain.  You have shortness of breath. In this test, a special dye (tracer) is put into your bloodstream. The tracer will travel to your heart. A camera will then take pictures of your heart to see how the tracer moves through your heart. This test is usually done at a hospital and takes 2-4 hours. Tell a doctor about:  Any allergies you have.  All medicines you are taking, including vitamins, herbs, eye drops, creams, and over-the-counter medicines.  Any problems you or family members have had with anesthetic medicines.  Any blood disorders you have.  Any surgeries you have had.  Any medical conditions you have.  Whether you are pregnant or may be pregnant. What are the risks? Generally, this is a safe test. However, problems may occur, such as:  Serious chest pain and heart attack. This is only a risk if the stress  portion of the test is done.  Rapid heartbeat.  A feeling of warmth in your chest. This feeling usually does not last long.  Allergic reaction to the tracer. What happens before the test?  Ask your doctor about changing or stopping your normal medicines. This is important.  Follow instructions from your doctor about what you cannot eat or drink.  Remove your jewelry on the day of the test. What happens during the test?  An IV tube will be inserted into one of your veins.  Your doctor will give you a small amount of tracer through the IV tube.  You will wait for 20-40 minutes while the tracer moves through your bloodstream.  Your heart will be monitored with an electrocardiogram (ECG).  You will lie down on an exam table.  Pictures of your heart will be taken for about 15-20 minutes.  You may also have a stress test. For this test, one of these things may be done: ? You will be asked to exercise on a treadmill or a stationary bike. ? You will be given medicines that will make your heart work harder. This is done if you are  unable to exercise.  When blood flow to your heart has peaked, a tracer will again be given through the IV tube.  After 20-40 minutes, you will get back on the exam table. More pictures will be taken of your heart.  Depending on the tracer that is used, more pictures may need to be taken 3-4 hours later.  Your IV tube will be removed when the test is over. The test may vary among doctors and hospitals. What happens after the test?  Ask your doctor: ? Whether you can return to your normal schedule, including diet, activities, and medicines. ? Whether you should drink more fluids. This will help to remove the tracer from your body. Drink enough fluid to keep your pee (urine) pale yellow.  Ask your doctor, or the department that is doing the test: ? When will my results be ready? ? How will I get my results? Summary  A cardiac nuclear scan is a test  that is done to check the flow of blood to your heart.  Tell your doctor whether you are pregnant or may be pregnant.  Before the test, ask your doctor about changing or stopping your normal medicines. This is important.  Ask your doctor whether you can return to your normal activities. You may be asked to drink more fluids. This information is not intended to replace advice given to you by your health care provider. Make sure you discuss any questions you have with your health care provider. Document Released: 01/02/2018 Document Revised: 11/08/2018 Document Reviewed: 01/02/2018 Elsevier Patient Education  2020 [employer].

## 2019-07-06 NOTE — Progress Notes (Signed)
Cardiology Office Note:    Date:  07/06/2019   ID:  Calvin Wilkerson, DOB 1965/12/11, MRN [id number]  PCP:  Lucianne Lei, MD  Cardiologist:  Garwin Brothers, MD   Referring MD: Lucianne Lei, MD    ASSESSMENT:    1. Mixed hyperlipidemia   2. Coronary artery disease involving native coronary artery of native heart without angina pectoris   3. HTN (hypertension), benign    PLAN:    In order of problems listed above:  1. Chest pain: Appears atypical for coronary etiology however patient has multiple risk factors for coronary artery disease.  Nitroglycerin use was detailed with the patient and education was given and he vocalized understanding.  His blood pressure is stable.  We will do a Lexiscan sestamibi to understand the symptoms. 2. Coronary artery disease overall stable.  He leads a sedentary lifestyle because of issues mentioned above.  He has been initiated on rabeprazole and because of interaction of the medication with Plavix we will switch it to Protonix.  He has Protonix at home and he will go back to Protonix medications.  He is gastroenterologist had switched him to rabeprazole. 3. Mixed dyslipidemia and elevated blood sugar: He will have blood work today including fasting lipids.  Importance of compliance with diet and medication stressed and weight reduction was stressed and he vocalized understanding. 4. Patient will be seen in follow-up appointment in 1 months or earlier if the patient has any concerns 5. Cigarette smoker: I spent 5 minutes with the patient discussing solely about smoking. Smoking cessation was counseled. I suggested to the patient also different medications and pharmacological interventions. Patient is keen to try stopping on its own at this time. He will get back to me if he needs any further assistance in this matter. 6. Patient had multiple questions which were answered to satisfaction.  Total time for this evaluation was 40 minutes.    Medication  Adjustments/Labs and Tests Ordered: Current medicines are reviewed at length with the patient today.  Concerns regarding medicines are outlined above.  No orders of the defined types were placed in this encounter.  No orders of the defined types were placed in this encounter.    Chief Complaint  Patient presents with  . Follow-up     History of Present Illness:    Calvin Wilkerson is a 53 y.o. male.  Patient has past medical history of coronary artery disease post stenting.  Unfortunately continues to smoke.  He smokes about 5 to 6 cigarettes a day.  He does not exercise on a regular basis because of the fact that he has had multiple surgeries on his stomach.  No orthopnea or PND.  I noticed that his hemoglobin A1c was 6.5.  He complains of occasional chest tightness not related to exertion.  This lasts about 30 seconds and goes away by itself.  He tells me that this is much different than the chest pains that he has had before stenting.  At the time of my evaluation, the patient is alert awake oriented and in no distress.  Past Medical History:  Diagnosis Date  . Aortic atherosclerosis (HCC)   . COPD (chronic obstructive pulmonary disease) (HCC)   . Crohn's disease (HCC)   . Erectile dysfunction   . GERD (gastroesophageal reflux disease)   . GI bleed   . History of diverticulitis   . History of recurrent TIAs   . Hypertension   . Hyperthyroidism   . Mood disorder (HCC)   .  PVD (peripheral vascular disease) (HCC)   . Status post laparoscopy 12/2016   removed mesh from previous hernia repair that was causing a strangulated small intestine.  . Vitamin D deficiency     Past Surgical History:  Procedure Laterality Date  . APPENDECTOMY    . COLECTOMY    . COLONOSCOPY  01/2016   Colonic polyp status post polypectomy. Few erosions in the TI consistent with history of Crohns Disease. Mild pancolonic diverticulosis. Internal hemorrhoids.   . COLOSTOMY REVERSAL    . CORONARY STENT  INTERVENTION N/A 03/16/2019   Procedure: CORONARY STENT INTERVENTION;  Surgeon: Tonny Bollmanooper, Michael, MD;  [facility];  Service: Cardiovascular;  Laterality: N/A;  . [place]    . HERNIA REPAIR    . LEFT HEART CATH AND CORONARY ANGIOGRAPHY N/A 03/16/2019   Procedure: LEFT HEART CATH AND CORONARY ANGIOGRAPHY;  Surgeon: Tonny Bollmanooper, Michael, MD;  [facility];  Service: Cardiovascular;  Laterality: N/A;  . UPPER GI ENDOSCOPY  2011   Healed esophageal erosions. Mild gastritis.     Current Medications: Current Meds  Medication Sig  . acetaminophen (TYLENOL) 500 MG tablet Take 1,000 mg by mouth every 6 (six) hours as needed (pain).   Marland Kitchen. albuterol (VENTOLIN HFA) 108 (90 Base) MCG/ACT inhaler 2 puffs daily.   Marland Kitchen. aspirin EC 81 MG tablet Take 81 mg by mouth daily.  Marland Kitchen. atorvastatin (LIPITOR) 20 MG tablet Take 20 mg by mouth daily.  Marland Kitchen. buPROPion (WELLBUTRIN XL) 150 MG 24 hr tablet Take 150 mg by mouth daily.  . clopidogrel (PLAVIX) 75 MG tablet Take 1 tablet (75 mg total) by mouth daily.  Marland Kitchen. dicyclomine (BENTYL) 10 MG capsule TAKE 1 CAPSULE BY MOUTH 4 TIMES DAILY BEFORE MEAL(S) AND AT BEDTIME  . docusate sodium (COLACE) 100 MG capsule Take 100 mg by mouth daily.   Marland Kitchen. EPINEPHrine 0.3 mg/0.3 mL IJ SOAJ injection Inject into the muscle.  . fluticasone (FLONASE) 50 MCG/ACT nasal spray Place 1 spray into both nostrils daily.   . furosemide (LASIX) 20 MG tablet Take 40 mg by mouth daily.   Marland Kitchen. gabapentin (NEURONTIN) 300 MG capsule Take 300 mg by mouth 2 (two) times daily as needed (pain).   Marland Kitchen. losartan-hydrochlorothiazide (HYZAAR) 50-12.5 MG tablet Take 1 tablet by mouth daily.  . methimazole (TAPAZOLE) 5 MG tablet Take 5 mg by mouth daily.  . montelukast (SINGULAIR) 10 MG tablet Take 10 mg by mouth at bedtime.  . Multiple Vitamins-Minerals (MULTIVITAMIN WITH MINERALS) tablet Take 1 tablet by mouth daily.  . pantoprazole (PROTONIX) 40 MG tablet Take 1 tablet (40 mg total) by mouth daily.   . RABEprazole (ACIPHEX) 20 MG tablet Take 1 tablet (20 mg total) by mouth daily.  . Vitamin D, Ergocalciferol, (DRISDOL) 50000 units CAPS capsule Take 50,000 Units by mouth every 7 (seven) days.     Allergies:   Patient has no known allergies.   Social History   Socioeconomic History  . Marital status: Married    Spouse name: Not on file  . Number of children: Not on file  . Years of education: Not on file  . Highest education level: Not on file  Occupational History  . Not on file  Social Needs  . Financial resource strain: Not on file  . Food insecurity    Worry: Not on file    Inability: Not on file  . Transportation needs    Medical: Not on file    Non-medical: Not on file  Tobacco Use  . Smoking status: Current Every Day Smoker    Packs/day: 1.50  . Smokeless tobacco: Never Used  Substance and Sexual Activity  . Alcohol use: Not Currently  . Drug use: Never  . Sexual activity: Not on file  Lifestyle  . Physical activity    Days per week: Not on file    Minutes per session: Not on file  . Stress: Not on file  Relationships  . Social Herbalist on phone: Not on file    Gets together: Not on file    Attends religious service: Not on file    Active member of club or organization: Not on file    Attends meetings of clubs or organizations: Not on file    Relationship status: Not on file  Other Topics Concern  . Not on file  Social History Narrative  . Not on file     Family History: The patient's family history includes Bone cancer in his brother and maternal grandmother; Breast cancer in his sister; Hypertension in his mother; Hypothyroidism in his mother.  ROS:   Please see the history of present illness.    All other systems reviewed and are negative.  EKGs/Labs/Other Studies Reviewed:    The following studies were reviewed today: Study date: 03/16/19  Physicians  Panel Physicians Referring Physician Case Authorizing Physician  Sherren Mocha, MD (Primary)    Procedures  CORONARY STENT INTERVENTION  LEFT HEART CATH AND CORONARY ANGIOGRAPHY  Conclusion    Prox RCA lesion is 75% stenosed.  A drug-eluting stent was successfully placed using a STENT SYNERGY DES 4X20.  Post intervention, there is a 0% residual stenosis.  Mid LAD lesion is 40% stenosed.   1. Severe single vessel CAD involving a large, dominant RCA, treated successfully with PCI using a 4.0x20 mm Synergy DES 2. Patent left main, LAD (nonobstructive stenosis), and LCx (diminutive circumflex territory)  Recommend: ASA/Clopidogrel x 6 months minimum. Same day PCI protocol if no complications       Recent Labs: 03/08/2019: Hemoglobin 15.3; Magnesium 1.9; Platelets 213.0 05/02/2019: ALT 20; BUN 9; Creatinine, Ser 0.97; Potassium 3.7; Sodium 139  Recent Lipid Panel No results found for: CHOL, TRIG, HDL, CHOLHDL, VLDL, LDLCALC, LDLDIRECT  Physical Exam:    VS:  BP 130/82 (BP Location: Left Arm, Patient Position: Sitting, Cuff Size: Normal)   Pulse 70   Ht 6\' 2"  (1.88 m)   Wt 265 lb (120.2 kg)   SpO2 94%   BMI 34.02 kg/m     Wt Readings from Last 3 Encounters:  07/06/19 265 lb (120.2 kg)  05/02/19 262 lb 8 oz (119.1 kg)  04/19/19 267 lb 12.8 oz (121.5 kg)     GEN: Patient is in no acute distress HEENT: Normal NECK: No JVD; No carotid bruits LYMPHATICS: No lymphadenopathy CARDIAC: Hear sounds regular, 2/6 systolic murmur at the apex. RESPIRATORY:  Clear to auscultation without rales, wheezing or rhonchi  ABDOMEN: Soft, non-tender, non-distended MUSCULOSKELETAL:  No edema; No deformity  SKIN: Warm and dry NEUROLOGIC:  Alert and oriented x 3 PSYCHIATRIC:  Normal affect   Signed, Jenean Lindau, MD  07/06/2019 8:43 AM    [place]

## 2019-07-07 LAB — LIPID PANEL
Chol/HDL Ratio: 4.7 ratio (ref 0.0–5.0)
Cholesterol, Total: 163 mg/dL (ref 100–199)
HDL: 35 mg/dL — ABNORMAL LOW (ref >39)
LDL Chol Calc (NIH): 80 mg/dL (ref 0–99)
Triglycerides: 293 mg/dL — ABNORMAL HIGH (ref 0–149)
VLDL Cholesterol Cal: 48 mg/dL — ABNORMAL HIGH (ref 5–40)

## 2019-07-07 LAB — CBC
Hematocrit: 47.5 % (ref 37.5–51.0)
Hemoglobin: 16 g/dL (ref 13.0–17.7)
MCH: 31.8 pg (ref 26.6–33.0)
MCHC: 33.7 g/dL (ref 31.5–35.7)
MCV: 94 fL (ref 79–97)
Platelets: 237 10*3/uL (ref 150–450)
RBC: 5.03 x10E6/uL (ref 4.14–5.80)
RDW: 12.8 % (ref 11.6–15.4)
WBC: 10.9 10*3/uL — ABNORMAL HIGH (ref 3.4–10.8)

## 2019-07-07 LAB — BASIC METABOLIC PANEL
BUN/Creatinine Ratio: 13 (ref 9–20)
BUN: 12 mg/dL (ref 6–24)
CO2: 21 mmol/L (ref 20–29)
Calcium: 9.8 mg/dL (ref 8.7–10.2)
Chloride: 99 mmol/L (ref 96–106)
Creatinine, Ser: 0.93 mg/dL (ref 0.76–1.27)
GFR calc Af Amer: 106 mL/min/{1.73_m2} (ref >59)
GFR calc non Af Amer: 92 mL/min/{1.73_m2} (ref >59)
Glucose: 156 mg/dL — ABNORMAL HIGH (ref 65–99)
Potassium: 4.5 mmol/L (ref 3.5–5.2)
Sodium: 137 mmol/L (ref 134–144)

## 2019-07-07 LAB — HEPATIC FUNCTION PANEL
ALT: 29 IU/L (ref 0–44)
AST: 23 IU/L (ref 0–40)
Albumin: 4.4 g/dL (ref 3.8–4.9)
Alkaline Phosphatase: 79 IU/L (ref 39–117)
Bilirubin Total: 0.5 mg/dL (ref 0.0–1.2)
Bilirubin, Direct: 0.16 mg/dL (ref 0.00–0.40)
Total Protein: 7.3 g/dL (ref 6.0–8.5)

## 2019-07-07 LAB — TSH: TSH: 0.988 u[IU]/mL (ref 0.450–4.500)

## 2019-07-10 ENCOUNTER — Telehealth: Payer: Self-pay

## 2019-07-10 NOTE — Telephone Encounter (Signed)
-----   Message from Jenean Lindau, MD sent at 07/09/2019 11:56 AM EST ----- Triglycerides are elevated.  Please have patient cut down carbohydrates and fried fatty food significantly from diet and also soft drinks.  Recheck in 3 months. Jenean Lindau, MD 07/09/2019 11:56 AM

## 2019-07-10 NOTE — Telephone Encounter (Signed)
Results relayed, copy sent to Dr. Uppin 

## 2019-07-17 DIAGNOSIS — I10 Essential (primary) hypertension: Secondary | ICD-10-CM | POA: Diagnosis not present

## 2019-07-17 DIAGNOSIS — Z8679 Personal history of other diseases of the circulatory system: Secondary | ICD-10-CM | POA: Diagnosis not present

## 2019-07-17 DIAGNOSIS — E782 Mixed hyperlipidemia: Secondary | ICD-10-CM | POA: Diagnosis not present

## 2019-07-17 DIAGNOSIS — J449 Chronic obstructive pulmonary disease, unspecified: Secondary | ICD-10-CM | POA: Diagnosis not present

## 2019-07-30 DIAGNOSIS — L0291 Cutaneous abscess, unspecified: Secondary | ICD-10-CM | POA: Diagnosis not present

## 2019-08-06 DIAGNOSIS — R05 Cough: Secondary | ICD-10-CM | POA: Diagnosis not present

## 2019-08-06 DIAGNOSIS — Z20822 Contact with and (suspected) exposure to covid-19: Secondary | ICD-10-CM | POA: Diagnosis not present

## 2019-08-06 DIAGNOSIS — R519 Headache, unspecified: Secondary | ICD-10-CM | POA: Diagnosis not present

## 2019-08-06 DIAGNOSIS — J029 Acute pharyngitis, unspecified: Secondary | ICD-10-CM | POA: Diagnosis not present

## 2019-08-06 DIAGNOSIS — U071 COVID-19: Secondary | ICD-10-CM | POA: Diagnosis not present

## 2019-08-14 ENCOUNTER — Ambulatory Visit: Payer: BC Managed Care – PPO

## 2019-08-15 ENCOUNTER — Ambulatory Visit: Payer: BC Managed Care – PPO

## 2019-08-17 ENCOUNTER — Ambulatory Visit: Payer: BC Managed Care – PPO | Admitting: Cardiology

## 2019-08-31 DIAGNOSIS — I10 Essential (primary) hypertension: Secondary | ICD-10-CM | POA: Diagnosis not present

## 2019-08-31 DIAGNOSIS — Z8679 Personal history of other diseases of the circulatory system: Secondary | ICD-10-CM | POA: Diagnosis not present

## 2019-08-31 DIAGNOSIS — R6889 Other general symptoms and signs: Secondary | ICD-10-CM | POA: Diagnosis not present

## 2019-08-31 DIAGNOSIS — J449 Chronic obstructive pulmonary disease, unspecified: Secondary | ICD-10-CM | POA: Diagnosis not present

## 2019-09-03 ENCOUNTER — Encounter: Payer: Self-pay | Admitting: Gastroenterology

## 2019-09-05 ENCOUNTER — Encounter: Payer: Self-pay | Admitting: *Deleted

## 2019-09-05 ENCOUNTER — Telehealth: Payer: Self-pay | Admitting: *Deleted

## 2019-09-05 NOTE — Telephone Encounter (Signed)
Patient given detailed instructions per Myocardial Perfusion Study Information Sheet for the test on 09/11/2019 at 1115. Patient notified to arrive 15 minutes early and that it is imperative to arrive on time for appointment to keep from having the test rescheduled.  If you need to cancel or reschedule your appointment, please call the office within 24 hours of your appointment. . Patient verbalized understanding.Cimberly Stoffel, Rise Paganini letter sent with instructions

## 2019-09-11 ENCOUNTER — Ambulatory Visit (INDEPENDENT_AMBULATORY_CARE_PROVIDER_SITE_OTHER): Payer: BC Managed Care – PPO

## 2019-09-11 ENCOUNTER — Other Ambulatory Visit: Payer: Self-pay

## 2019-09-11 VITALS — Ht 74.0 in | Wt 265.0 lb

## 2019-09-11 DIAGNOSIS — R0609 Other forms of dyspnea: Secondary | ICD-10-CM

## 2019-09-11 DIAGNOSIS — R0600 Dyspnea, unspecified: Secondary | ICD-10-CM | POA: Diagnosis not present

## 2019-09-11 DIAGNOSIS — I2510 Atherosclerotic heart disease of native coronary artery without angina pectoris: Secondary | ICD-10-CM

## 2019-09-11 DIAGNOSIS — R079 Chest pain, unspecified: Secondary | ICD-10-CM | POA: Diagnosis not present

## 2019-09-11 LAB — MYOCARDIAL PERFUSION IMAGING
LV dias vol: 93 mL (ref 62–150)
LV sys vol: 30 mL
Peak HR: 85 {beats}/min
Rest HR: 56 {beats}/min
SDS: 1
SRS: 4
SSS: 5
TID: 1.07

## 2019-09-11 MED ORDER — TECHNETIUM TC 99M TETROFOSMIN IV KIT
10.7000 | PACK | Freq: Once | INTRAVENOUS | Status: AC | PRN
  Administered 2019-09-11: 10.7 via INTRAVENOUS

## 2019-09-11 MED ORDER — REGADENOSON 0.4 MG/5ML IV SOLN
0.4000 mg | Freq: Once | INTRAVENOUS | Status: AC
  Administered 2019-09-11: 0.4 mg via INTRAVENOUS

## 2019-09-11 MED ORDER — TECHNETIUM TC 99M TETROFOSMIN IV KIT
32.0000 | PACK | Freq: Once | INTRAVENOUS | Status: AC | PRN
  Administered 2019-09-11: 32 via INTRAVENOUS

## 2019-09-12 ENCOUNTER — Ambulatory Visit: Payer: BC Managed Care – PPO

## 2019-09-24 ENCOUNTER — Ambulatory Visit (INDEPENDENT_AMBULATORY_CARE_PROVIDER_SITE_OTHER): Payer: BC Managed Care – PPO | Admitting: Cardiology

## 2019-09-24 ENCOUNTER — Encounter: Payer: Self-pay | Admitting: Cardiology

## 2019-09-24 ENCOUNTER — Other Ambulatory Visit: Payer: Self-pay

## 2019-09-24 VITALS — BP 124/76 | HR 75 | Ht 74.0 in | Wt 267.0 lb

## 2019-09-24 DIAGNOSIS — E782 Mixed hyperlipidemia: Secondary | ICD-10-CM | POA: Diagnosis not present

## 2019-09-24 DIAGNOSIS — I2510 Atherosclerotic heart disease of native coronary artery without angina pectoris: Secondary | ICD-10-CM

## 2019-09-24 DIAGNOSIS — I10 Essential (primary) hypertension: Secondary | ICD-10-CM | POA: Diagnosis not present

## 2019-09-24 NOTE — Progress Notes (Signed)
Cardiology Office Note:    Date:  09/24/2019   ID:  [name], DOB March 19, 1966, MRN [id number]  PCP:  Calvin Lei, MD  Cardiologist:  Garwin Brothers, MD   Referring MD: Calvin Lei, MD    ASSESSMENT:    No diagnosis found. PLAN:    In order of problems listed above:  1. Angina pectoris: Coronary artery disease: Secondary prevention stressed with the patient.  Importance of compliance with diet and medication stressed and he vocalized understanding.  His angina is stable.  He started walking some on a regular basis.  His chest pain is pretty much resolved.  He does not get them and those symptoms hardly any. 2. Essential hypertension: Blood pressure stable 3. Mixed dyslipidemia: Diet was explained for this.  Reduction in carbohydrates and fried and fatty foods were discussed.  Diet was discussed at length and he promises to do better. 4. Cigarette smoker: He tells me that he is on the verge of quitting and I counseled him to do it as soon as possible. 5. Patient will be seen in follow-up appointment in 3 months or earlier if the patient has any concerns    Medication Adjustments/Labs and Tests Ordered: Current medicines are reviewed at length with the patient today.  Concerns regarding medicines are outlined above.  No orders of the defined types were placed in this encounter.  No orders of the defined types were placed in this encounter.    Chief Complaint  Patient presents with  . Follow-up    1 Month     History of Present Illness:    Calvin Wilkerson is a 54 y.o. male.  Patient has past medical history of coronary artery disease, essential hypertension and dyslipidemia and obesity.  He denies any problems at this time and takes care of activities of daily living.  No chest pain orthopnea or PND.  Unfortunately continues to smoke.  Changes in acid reflux medications have made him feel better.  He has no chest pain at this time.  He leads a sedentary lifestyle.  At the  time of my evaluation, the patient is alert awake oriented and in no distress.  Past Medical History:  Diagnosis Date  . Aortic atherosclerosis (HCC)   . COPD (chronic obstructive pulmonary disease) (HCC)   . Crohn's disease (HCC)   . Erectile dysfunction   . GERD (gastroesophageal reflux disease)   . GI bleed   . History of diverticulitis   . History of recurrent TIAs   . Hypertension   . Hyperthyroidism   . Mood disorder (HCC)   . PVD (peripheral vascular disease) (HCC)   . Status post laparoscopy 12/2016   removed mesh from previous hernia repair that was causing a strangulated small intestine.  . Vitamin D deficiency     Past Surgical History:  Procedure Laterality Date  . APPENDECTOMY    . COLECTOMY    . COLONOSCOPY  01/2016   Colonic polyp status post polypectomy. Few erosions in the TI consistent with history of Crohns Disease. Mild pancolonic diverticulosis. Internal hemorrhoids.   . COLOSTOMY REVERSAL    . CORONARY STENT INTERVENTION N/A 03/16/2019   Procedure: CORONARY STENT INTERVENTION;  Surgeon: Tonny Bollman, MD;  Location: [facility] INVASIVE CV LAB;  Service: Cardiovascular;  Laterality: N/A;  . [place]mann[place]    . HERNIA REPAIR    . LEFT HEART CATH AND CORONARY ANGIOGRAPHY N/A 03/16/2019   Procedure: LEFT HEART CATH AND CORONARY ANGIOGRAPHY;  Surgeon: Tonny Bollman, MD;  Location: [facility];  Service: Cardiovascular;  Laterality: N/A;  . UPPER GI ENDOSCOPY  2011   Healed esophageal erosions. Mild gastritis.     Current Medications: No outpatient medications have been marked as taking for the 09/24/19 encounter (Office Visit) with Gurnie Duris, Reita Cliche, MD.     Allergies:   Patient has no known allergies.   Social History   Socioeconomic History  . Marital status: Married    Spouse name: Not on file  . Number of children: Not on file  . Years of education: Not on file  . Highest education level: Not on file  Occupational History  . Not on file  Tobacco  Use  . Smoking status: Current Every Day Smoker    Packs/day: 1.50  . Smokeless tobacco: Never Used  Substance and Sexual Activity  . Alcohol use: Not Currently  . Drug use: Never  . Sexual activity: Not on file  Other Topics Concern  . Not on file  Social History Narrative  . Not on file   Social Determinants of Health   Financial Resource Strain:   . Difficulty of Paying Living Expenses: Not on file  Food Insecurity:   . Worried About Charity fundraiser in the Last Year: Not on file  . Ran Out of Food in the Last Year: Not on file  Transportation Needs:   . Lack of Transportation (Medical): Not on file  . Lack of Transportation (Non-Medical): Not on file  Physical Activity:   . Days of Exercise per Week: Not on file  . Minutes of Exercise per Session: Not on file  Stress:   . Feeling of Stress : Not on file  Social Connections:   . Frequency of Communication with Friends and Family: Not on file  . Frequency of Social Gatherings with Friends and Family: Not on file  . Attends Religious Services: Not on file  . Active Member of Clubs or Organizations: Not on file  . Attends Archivist Meetings: Not on file  . Marital Status: Not on file     Family History: The patient's family history includes Bone cancer in his brother and maternal grandmother; Breast cancer in his sister; Hypertension in his mother; Hypothyroidism in his mother.  ROS:   Please see the history of present illness.    All other systems reviewed and are negative.  EKGs/Labs/Other Studies Reviewed:    The following studies were reviewed today: Sherren Mocha, MD (Primary)    Procedures  CORONARY STENT INTERVENTION  LEFT HEART CATH AND CORONARY ANGIOGRAPHY  Conclusion    Prox RCA lesion is 75% stenosed.  A drug-eluting stent was successfully placed using a STENT SYNERGY DES 4X20.  Post intervention, there is a 0% residual stenosis.  Mid LAD lesion is 40% stenosed.   1. Severe  single vessel CAD involving a large, dominant RCA, treated successfully with PCI using a 4.0x20 mm Synergy DES 2. Patent left main, LAD (nonobstructive stenosis), and LCx (diminutive circumflex territory)  Recommend: ASA/Clopidogrel x 6 months minimum. Same day PCI protocol if no complications   Study Highlights   The left ventricular ejection fraction is hyperdynamic (>65%).  Nuclear stress EF: 68%.  There was no ST segment deviation noted during stress.  This is a low risk study.  No evidence of ischemia or MI  Normal EF.        Recent Labs: 03/08/2019: Magnesium 1.9 07/06/2019: ALT 29; BUN 12; Creatinine, Ser 0.93; Hemoglobin 16.0; Platelets 237;  Potassium 4.5; Sodium 137; TSH 0.988  Recent Lipid Panel    Component Value Date/Time   CHOL 163 07/06/2019 0918   TRIG 293 (H) 07/06/2019 0918   HDL 35 (L) 07/06/2019 0918   CHOLHDL 4.7 07/06/2019 0918   LDLCALC 80 07/06/2019 0918    Physical Exam:    VS:  Ht 6\' 2"  (1.88 m)   Wt 267 lb (121.1 kg)   BMI 34.28 kg/m     Wt Readings from Last 3 Encounters:  09/24/19 267 lb (121.1 kg)  09/11/19 265 lb (120.2 kg)  07/06/19 265 lb (120.2 kg)     GEN: Patient is in no acute distress HEENT: Normal NECK: No JVD; No carotid bruits LYMPHATICS: No lymphadenopathy CARDIAC: Hear sounds regular, 2/6 systolic murmur at the apex. RESPIRATORY:  Clear to auscultation without rales, wheezing or rhonchi  ABDOMEN: Soft, non-tender, non-distended MUSCULOSKELETAL:  No edema; No deformity  SKIN: Warm and dry NEUROLOGIC:  Alert and oriented x 3 PSYCHIATRIC:  Normal affect   Signed, 14/04/20, MD  09/24/2019 11:15 AM    [facility]

## 2019-09-24 NOTE — Patient Instructions (Addendum)
Medication Instructions:  Your physician recommends that you continue on your current medications as directed. Please refer to the Current Medication list given to you today.  *If you need a refill on your cardiac medications before your next appointment, please call your pharmacy*  Lab Work: None ordered  If you have labs (blood work) drawn today and your tests are completely normal, you will receive your results only by: Marland Kitchen MyChart Message (if you have MyChart) OR . A paper copy in the mail If you have any lab test that is abnormal or we need to change your treatment, we will call you to review the results.  Testing/Procedures: None ordered  Follow-Up: At [facility], you and your health needs are our priority.  As part of our continuing mission to provide you with exceptional heart care, we have created designated Provider Care Teams.  These Care Teams include your primary Cardiologist (physician) and Advanced Practice Providers (APPs -  Physician Assistants and Nurse Practitioners) who all work together to provide you with the care you need, when you need it.  Your next appointment:   3 month(s)   12/24/2019 8:05 a.m.  The format for your next appointment:   In Person  Provider:   Belva Crome, MD  Other Instructions

## 2019-10-19 DIAGNOSIS — Z79899 Other long term (current) drug therapy: Secondary | ICD-10-CM | POA: Diagnosis not present

## 2019-10-19 DIAGNOSIS — E0500 Thyrotoxicosis with diffuse goiter without thyrotoxic crisis or storm: Secondary | ICD-10-CM | POA: Diagnosis not present

## 2019-10-23 ENCOUNTER — Other Ambulatory Visit: Payer: Self-pay | Admitting: Gastroenterology

## 2019-10-23 DIAGNOSIS — M129 Arthropathy, unspecified: Secondary | ICD-10-CM | POA: Diagnosis not present

## 2019-10-23 DIAGNOSIS — J449 Chronic obstructive pulmonary disease, unspecified: Secondary | ICD-10-CM | POA: Diagnosis not present

## 2019-10-23 DIAGNOSIS — I10 Essential (primary) hypertension: Secondary | ICD-10-CM | POA: Diagnosis not present

## 2019-10-23 DIAGNOSIS — Z8679 Personal history of other diseases of the circulatory system: Secondary | ICD-10-CM | POA: Diagnosis not present

## 2019-11-20 DIAGNOSIS — I739 Peripheral vascular disease, unspecified: Secondary | ICD-10-CM | POA: Diagnosis not present

## 2019-11-20 DIAGNOSIS — M129 Arthropathy, unspecified: Secondary | ICD-10-CM | POA: Diagnosis not present

## 2019-11-20 DIAGNOSIS — I10 Essential (primary) hypertension: Secondary | ICD-10-CM | POA: Diagnosis not present

## 2019-11-20 DIAGNOSIS — R600 Localized edema: Secondary | ICD-10-CM | POA: Diagnosis not present

## 2019-11-22 ENCOUNTER — Other Ambulatory Visit: Payer: Self-pay

## 2019-11-22 ENCOUNTER — Other Ambulatory Visit: Payer: Self-pay | Admitting: Physician Assistant

## 2019-11-22 MED ORDER — DICYCLOMINE HCL 10 MG PO CAPS: ORAL_CAPSULE | ORAL | 2 refills | Status: DC

## 2019-11-28 ENCOUNTER — Other Ambulatory Visit: Payer: Self-pay | Admitting: *Deleted

## 2019-11-28 DIAGNOSIS — M25561 Pain in right knee: Secondary | ICD-10-CM

## 2019-11-28 DIAGNOSIS — M25562 Pain in left knee: Secondary | ICD-10-CM

## 2019-11-29 ENCOUNTER — Telehealth (HOSPITAL_COMMUNITY): Payer: Self-pay

## 2019-11-29 DIAGNOSIS — Z79899 Other long term (current) drug therapy: Secondary | ICD-10-CM | POA: Diagnosis not present

## 2019-11-29 DIAGNOSIS — E559 Vitamin D deficiency, unspecified: Secondary | ICD-10-CM | POA: Diagnosis not present

## 2019-11-29 DIAGNOSIS — E119 Type 2 diabetes mellitus without complications: Secondary | ICD-10-CM | POA: Diagnosis not present

## 2019-11-29 NOTE — Telephone Encounter (Signed)

## 2019-11-30 ENCOUNTER — Encounter: Payer: Self-pay | Admitting: Vascular Surgery

## 2019-11-30 ENCOUNTER — Ambulatory Visit (HOSPITAL_COMMUNITY)
Admission: RE | Admit: 2019-11-30 | Discharge: 2019-11-30 | Disposition: A | Discharge status: Home / Self Care | Payer: BC Managed Care – PPO | Source: Ambulatory Visit | Attending: Vascular Surgery | Admitting: Vascular Surgery

## 2019-11-30 ENCOUNTER — Ambulatory Visit (INDEPENDENT_AMBULATORY_CARE_PROVIDER_SITE_OTHER): Payer: BC Managed Care – PPO | Admitting: Vascular Surgery

## 2019-11-30 ENCOUNTER — Other Ambulatory Visit: Payer: Self-pay

## 2019-11-30 VITALS — BP 117/73 | HR 56 | Temp 98.1°F | Resp 18 | Ht 73.5 in | Wt 253.0 lb

## 2019-11-30 DIAGNOSIS — M25562 Pain in left knee: Secondary | ICD-10-CM

## 2019-11-30 DIAGNOSIS — M25561 Pain in right knee: Secondary | ICD-10-CM | POA: Diagnosis not present

## 2019-11-30 DIAGNOSIS — I700 Atherosclerosis of aorta: Secondary | ICD-10-CM

## 2019-11-30 NOTE — Progress Notes (Signed)
Patient ID: Calvin Wilkerson, male   DOB: 10/10/1965, 54 y.o.   MRN: [id number]  Reason for Consult: New Patient (Initial Visit) (leg pain and swelling)   Referred by Lucianne Lei, MD  Subjective:     HPI:  Calvin Wilkerson is a 54 y.o. male has a history of coronary artery disease and also history of multiple abdominal procedures.  He has swelling in his bilateral lower extremities and also has blueness discoloration of toes with standing.  He does wear compression stockings this helps the swelling.  Is not any tissue ulceration lower extremities.  He states that this causes significant discomfort with walking.  Patient has lost 13 pounds with activity.  He had quit smoking unfortunately is still occasionally smoking.  He does not take any blood thinners but has been placed on aspirin Plavix and statin for his coronary artery disease.  Denies any history of stroke.  Denies any family history or personal history of abdominal aortic aneurysm.  Past Medical History:  Diagnosis Date  . Aortic atherosclerosis (HCC)   . COPD (chronic obstructive pulmonary disease) (HCC)   . Crohn's disease (HCC)   . Erectile dysfunction   . GERD (gastroesophageal reflux disease)   . GI bleed   . History of diverticulitis   . History of recurrent TIAs   . Hypertension   . Hyperthyroidism   . Mood disorder (HCC)   . PVD (peripheral vascular disease) (HCC)   . Status post laparoscopy 12/2016   removed mesh from previous hernia repair that was causing a strangulated small intestine.  . Vitamin D deficiency    Family History  Problem Relation Age of Onset  . Hypothyroidism Mother   . Hypertension Mother   . Aneurysm Mother   . Breast cancer Sister   . Bone cancer Brother   . Aneurysm Brother   . Bone cancer Maternal Grandmother    Past Surgical History:  Procedure Laterality Date  . APPENDECTOMY    . COLECTOMY    . COLONOSCOPY  01/2016   Colonic polyp status post polypectomy. Few erosions in the TI  consistent with history of Crohns Disease. Mild pancolonic diverticulosis. Internal hemorrhoids.   . COLOSTOMY REVERSAL    . CORONARY STENT INTERVENTION N/A 03/16/2019   Procedure: CORONARY STENT INTERVENTION;  Surgeon: Tonny Bollman, MD;  Location: [facility] INVASIVE CV LAB;  Service: Cardiovascular;  Laterality: N/A;  . [place]    . HERNIA REPAIR    . LEFT HEART CATH AND CORONARY ANGIOGRAPHY N/A 03/16/2019   Procedure: LEFT HEART CATH AND CORONARY ANGIOGRAPHY;  Surgeon: Tonny Bollman, MD;  Location: [facility] INVASIVE CV LAB;  Service: Cardiovascular;  Laterality: N/A;  . UPPER GI ENDOSCOPY  2011   Healed esophageal erosions. Mild gastritis.     Short Social History:  Social History   Tobacco Use  . Smoking status: Former Smoker    Packs/day: 1.50    Quit date: 09/03/2019    Years since quitting: 0.2  . Smokeless tobacco: Never Used  Substance Use Topics  . Alcohol use: Not Currently    No Known Allergies  Current Outpatient Medications  Medication Sig Dispense Refill  . acetaminophen (TYLENOL) 500 MG tablet Take 1,000 mg by mouth every 6 (six) hours as needed (pain).     Marland Kitchen albuterol (VENTOLIN HFA) 108 (90 Base) MCG/ACT inhaler 2 puffs daily.     Marland Kitchen aspirin EC 81 MG tablet Take 81 mg by mouth daily.    Marland Kitchen atorvastatin (LIPITOR) 20 MG  tablet Take 20 mg by mouth daily.    Marland Kitchen buPROPion (WELLBUTRIN XL) 150 MG 24 hr tablet Take 150 mg by mouth daily.    . clopidogrel (PLAVIX) 75 MG tablet TAKE 1 TABLET BY MOUTH DAILY 90 tablet 3  . dicyclomine (BENTYL) 10 MG capsule TAKE 1 CAPSULE BY MOUTH 4 TIMES DAILY BEFORE MEAL(S) AND AT BEDTIME 120 capsule 2  . docusate sodium (COLACE) 100 MG capsule Take 100 mg by mouth daily.     Marland Kitchen EPINEPHrine 0.3 mg/0.3 mL IJ SOAJ injection Inject into the muscle.    . fluticasone (FLONASE) 50 MCG/ACT nasal spray Place 1 spray into both nostrils daily.     . furosemide (LASIX) 20 MG tablet Take 40 mg by mouth daily.     Marland Kitchen gabapentin (NEURONTIN) 300 MG capsule Take 300  mg by mouth 2 (two) times daily as needed (pain).     Marland Kitchen losartan-hydrochlorothiazide (HYZAAR) 50-12.5 MG tablet Take 1 tablet by mouth daily.    . methimazole (TAPAZOLE) 5 MG tablet Take 5 mg by mouth daily.    . montelukast (SINGULAIR) 10 MG tablet Take 10 mg by mouth at bedtime.    . Multiple Vitamins-Minerals (MULTIVITAMIN WITH MINERALS) tablet Take 1 tablet by mouth daily.    Marland Kitchen PROTONIX 40 MG tablet TAKE 1 TABLET BY MOUTH ONCE A DAY 90 tablet 1  . Vitamin D, Ergocalciferol, (DRISDOL) 50000 units CAPS capsule Take 50,000 Units by mouth every 7 (seven) days.    . nitroGLYCERIN (NITROSTAT) 0.4 MG SL tablet Place 1 tablet (0.4 mg total) under the tongue every 5 (five) minutes as needed. 25 tablet 3   No current facility-administered medications for this visit.    Review of Systems  Constitutional:  Constitutional negative. HENT: HENT negative.  Eyes: Eyes negative.  Cardiovascular: Positive for leg swelling.  GI:       He has numbness of his abdomen with previous surgeries Musculoskeletal: Positive for leg pain.  Skin: Skin negative.  Neurological: Neurological negative. Hematologic: Hematologic/lymphatic negative.  Psychiatric: Psychiatric negative.        Objective:  Objective   Vitals:   11/30/19 0917  BP: 117/73  Pulse: (!) 56  Resp: 18  Temp: 98.1 F (36.7 C)  TempSrc: Temporal  SpO2: 97%  Weight: 253 lb (114.8 kg)  Height: 6' 1.5" (1.867 m)   Body mass index is 32.93 kg/m.  Physical Exam Constitutional:      Appearance: He is obese.  HENT:     Head: Normocephalic.     Nose:     Comments: Mask in place Eyes:     Pupils: Pupils are equal, round, and reactive to light.  Cardiovascular:     Rate and Rhythm: Normal rate.     Pulses:          Dorsalis pedis pulses are 1+ on the right side and 1+ on the left side.       Posterior tibial pulses are 1+ on the right side and 1+ on the left side.     Comments: Cannot feel femoral pulses due to body  habitus Pulmonary:     Effort: Pulmonary effort is normal.  Abdominal:     General: Abdomen is flat.     Palpations: Abdomen is soft.  Musculoskeletal:     Right lower leg: Edema present.     Left lower leg: Edema present.  Skin:    General: Skin is warm and dry.     Capillary Refill: Capillary refill  takes less than 2 seconds.  Neurological:     General: No focal deficit present.     Mental Status: He is alert.  Psychiatric:        Mood and Affect: Mood normal.        Behavior: Behavior normal.        Thought Content: Thought content normal.        Judgment: Judgment normal.     Data: I have independently turbid his ABIs to be 0.99 right and 1.07 left triphasic waveforms bilaterally.     Assessment/Plan:     54 year old male presents for evaluation of bilateral lower extremity pain with swelling.  He does wear compression stockings this helps with the swelling.  ABIs are normal he does not have any evidence of peripheral arterial disease but does have aortic atherosclerosis on recent CT scan.  I discussed with him the causes of swelling including cardiac causes for which he takes Lasix and also the need to lose weight and wear compression stockings and elevate his legs as tempering measures.  He demonstrates good understanding of this with his wife who is also a Marine scientist.  I have offered him reflux studies at this time patient wants to follow-up as needed.  We have discussed smoking cessation and weight loss and he demonstrates good understanding of this.  He will follow-up as needed.     Waynetta Sandy MD [facility]

## 2019-12-19 DIAGNOSIS — L0291 Cutaneous abscess, unspecified: Secondary | ICD-10-CM | POA: Diagnosis not present

## 2019-12-19 DIAGNOSIS — G5793 Unspecified mononeuropathy of bilateral lower limbs: Secondary | ICD-10-CM | POA: Diagnosis not present

## 2019-12-19 DIAGNOSIS — I739 Peripheral vascular disease, unspecified: Secondary | ICD-10-CM | POA: Diagnosis not present

## 2019-12-21 ENCOUNTER — Other Ambulatory Visit: Payer: Self-pay

## 2019-12-24 ENCOUNTER — Other Ambulatory Visit: Payer: Self-pay

## 2019-12-24 ENCOUNTER — Ambulatory Visit: Payer: BC Managed Care – PPO | Admitting: Cardiology

## 2019-12-24 ENCOUNTER — Encounter: Payer: Self-pay | Admitting: Cardiology

## 2019-12-24 VITALS — BP 110/72 | HR 72 | Temp 97.6°F | Ht 73.0 in | Wt 256.4 lb

## 2019-12-24 DIAGNOSIS — E782 Mixed hyperlipidemia: Secondary | ICD-10-CM | POA: Diagnosis not present

## 2019-12-24 DIAGNOSIS — I2510 Atherosclerotic heart disease of native coronary artery without angina pectoris: Secondary | ICD-10-CM | POA: Diagnosis not present

## 2019-12-24 DIAGNOSIS — I10 Essential (primary) hypertension: Secondary | ICD-10-CM | POA: Diagnosis not present

## 2019-12-24 DIAGNOSIS — G459 Transient cerebral ischemic attack, unspecified: Secondary | ICD-10-CM

## 2019-12-24 NOTE — Patient Instructions (Signed)

## 2019-12-24 NOTE — Progress Notes (Signed)
Cardiology Office Note:    Date:  12/24/2019   ID:  Calvin Wilkerson, DOB [birth year], MRN [id number]  PCP:  Calvin Lei, MD  Cardiologist:  Garwin Brothers, MD   Referring MD: Calvin Lei, MD    ASSESSMENT:    1. Coronary artery disease involving native coronary artery of native heart without angina pectoris   2. Mixed hyperlipidemia   3. HTN (hypertension), benign   4. TIA (transient ischemic attack)    PLAN:    In order of problems listed above:  1. Coronary artery disease: Secondary prevention stressed with the patient.  Importance of compliance with diet and medication stressed and he vocalized understanding.  Importance of regular exercise stressed he was advised to walk at least half an hour a day 5 days a week and he promises to do so. 2. Essential hypertension: Blood pressure stable and his primary care physician has titrated his blood pressure medications recently 3. Mixed dyslipidemia: Diet was emphasized and I told him to be specific in terms of emphasis on reducing carbohydrates and fried fatty foods and he promises to do so. 4. Diabetes mellitus and overweight status: Managed by primary care physician and diet was again emphasized. 5. Cigarette smoker: I spent 5 minutes with the patient discussing solely about smoking. Smoking cessation was counseled. I suggested to the patient also different medications and pharmacological interventions. Patient is keen to try stopping on its own at this time. He will get back to me if he needs any further assistance in this matter. 6. Patient will be seen in follow-up appointment in 6 months or earlier if the patient has any concerns    Medication Adjustments/Labs and Tests Ordered: Current medicines are reviewed at length with the patient today.  Concerns regarding medicines are outlined above.  No orders of the defined types were placed in this encounter.  No orders of the defined types were placed in this encounter.    No chief  complaint on file.    History of Present Illness:    Calvin Wilkerson is a 54 y.o. male.  Patient has past medical history of coronary atherosclerosis, essential hypertension dyslipidemia diabetes mellitus and cigarette smoking.  He denies any problems at this time and takes care of activities of daily living.  No chest pain orthopnea or PND.  At the time of my evaluation, the patient is alert awake oriented and in no distress.  He is working hard to exercise and lose weight and he tells me that he has lost approximately 10 pounds in the past 6 months.  He also mentions that he had blood work at the end of May and it was told that it was fine  Past Medical History:  Diagnosis Date  . Abnormal stress test showing ischemia involving inferior wall 02/19/2019  . Angina pectoris (HCC) 03/09/2019  . Aortic atherosclerosis (HCC)   . CAD (coronary artery disease) 07/06/2019  . Cellulitis of right knee 07/12/2016  . Cigarette smoker 01/15/2019  . COPD (chronic obstructive pulmonary disease) (HCC)   . Crohn's disease (HCC)   . DOE (dyspnea on exertion) 01/15/2019  . Erectile dysfunction   . GERD (gastroesophageal reflux disease)   . GI bleed   . History of diverticulitis   . History of recurrent TIAs   . HLD (hyperlipidemia) 12/24/2016  . HTN (hypertension), benign 12/24/2016  . Hypertension   . Hyperthyroidism   . Incisional hernia without obstruction or gangrene 12/31/2016  . Mood disorder (HCC)   .  PVD (peripheral vascular disease) ([place])   . S/P hernia repair 12/16/2016   Added automatically from request for surgery [phone]  Formatting of this note might be different from the original. Added automatically from request for surgery [phone]  . Status post laparoscopy 12/2016   removed mesh from previous hernia repair that was causing a strangulated small intestine.  Marland Kitchen TIA (transient ischemic attack) 09/05/2015  . Vitamin D deficiency     Past Surgical History:  Procedure Laterality Date  . APPENDECTOMY     . COLECTOMY    . COLONOSCOPY  01/2016   Colonic polyp status post polypectomy. Few erosions in the TI consistent with history of Crohns Disease. Mild pancolonic diverticulosis. Internal hemorrhoids.   . COLOSTOMY REVERSAL    . CORONARY STENT INTERVENTION N/A 03/16/2019   Procedure: CORONARY STENT INTERVENTION;  Surgeon: Sherren Mocha, MD;  Location: [place] [facility];  Service: Cardiovascular;  Laterality: N/A;  . [place]    . HERNIA REPAIR    . LEFT HEART CATH AND CORONARY ANGIOGRAPHY N/A 03/16/2019   Procedure: LEFT HEART CATH AND CORONARY ANGIOGRAPHY;  Surgeon: Sherren Mocha, MD;  Location: [place] [facility];  Service: Cardiovascular;  Laterality: N/A;  . UPPER GI ENDOSCOPY  2011   Healed esophageal erosions. Mild gastritis.     Current Medications: Current Meds  Medication Sig  . acetaminophen (TYLENOL) 500 MG tablet Take 1,000 mg by mouth every 6 (six) hours as needed (pain).   Marland Kitchen albuterol (VENTOLIN HFA) 108 (90 Base) MCG/ACT inhaler 2 puffs daily.   Marland Kitchen aspirin EC 81 MG tablet Take 81 mg by mouth daily.  Marland Kitchen atorvastatin (LIPITOR) 20 MG tablet Take 20 mg by mouth daily.  Marland Kitchen buPROPion (WELLBUTRIN XL) 150 MG 24 hr tablet Take 150 mg by mouth daily.  . clopidogrel (PLAVIX) 75 MG tablet TAKE 1 TABLET BY MOUTH DAILY  . dicyclomine (BENTYL) 10 MG capsule TAKE 1 CAPSULE BY MOUTH 4 TIMES DAILY BEFORE MEAL(S) AND AT BEDTIME  . docusate sodium (COLACE) 100 MG capsule Take 100 mg by mouth daily.   Marland Kitchen doxycycline (VIBRA-TABS) 100 MG tablet Take 1 tablet by mouth daily.  Marland Kitchen EPINEPHrine 0.3 mg/0.3 mL IJ SOAJ injection Inject into the muscle.  . fluticasone (FLONASE) 50 MCG/ACT nasal spray Place 1 spray into both nostrils daily.   . furosemide (LASIX) 20 MG tablet Take 40 mg by mouth daily.   Marland Kitchen gabapentin (NEURONTIN) 300 MG capsule Take 300 mg by mouth 2 (two) times daily as needed (pain).   Marland Kitchen losartan-hydrochlorothiazide (HYZAAR) 50-12.5 MG tablet Take 1 tablet by mouth daily.  . metFORMIN  (GLUCOPHAGE-XR) 500 MG 24 hr tablet Take 500 mg by mouth 2 (two) times daily.  . methimazole (TAPAZOLE) 5 MG tablet Take 5 mg by mouth daily.  . montelukast (SINGULAIR) 10 MG tablet Take 10 mg by mouth at bedtime.  . Multiple Vitamins-Minerals (MULTIVITAMIN WITH MINERALS) tablet Take 1 tablet by mouth daily.  Marland Kitchen PROTONIX 40 MG tablet TAKE 1 TABLET BY MOUTH ONCE A DAY  . sulfamethoxazole-trimethoprim (BACTRIM DS) 800-160 MG tablet Take 1 tablet by mouth 2 (two) times daily.  . traMADol (ULTRAM) 50 MG tablet Take 50 mg by mouth 2 (two) times daily as needed.  . Vitamin D, Ergocalciferol, (DRISDOL) 50000 units CAPS capsule Take 50,000 Units by mouth every 7 (seven) days.     Allergies:   Patient has no known allergies.   Social History   Socioeconomic History  . Marital status: Married  Spouse name: Not on file  . Number of children: Not on file  . Years of education: Not on file  . Highest education level: Not on file  Occupational History  . Not on file  Tobacco Use  . Smoking status: Former Smoker    Packs/day: 1.50    Quit date: 09/03/2019    Years since quitting: 0.3  . Smokeless tobacco: Never Used  Substance and Sexual Activity  . Alcohol use: Not Currently  . Drug use: Never  . Sexual activity: Not on file  Other Topics Concern  . Not on file  Social History Narrative  . Not on file   Social Determinants of Health   Financial Resource Strain:   . Difficulty of Paying Living Expenses:   Food Insecurity:   . Worried About Programme researcher, broadcasting/film/video in the Last Year:   . Barista in the Last Year:   Transportation Needs:   . Freight forwarder (Medical):   Marland Kitchen Lack of Transportation (Non-Medical):   Physical Activity:   . Days of Exercise per Week:   . Minutes of Exercise per Session:   Stress:   . Feeling of Stress :   Social Connections:   . Frequency of Communication with Friends and Family:   . Frequency of Social Gatherings with Friends and Family:   .  Attends Religious Services:   . Active Member of Clubs or Organizations:   . Attends Banker Meetings:   Marland Kitchen Marital Status:      Family History: The patient's family history includes Aneurysm in his brother and mother; Bone cancer in his brother and maternal grandmother; Breast cancer in his sister; Hypertension in his mother; Hypothyroidism in his mother.  ROS:   Please see the history of present illness.    All other systems reviewed and are negative.  EKGs/Labs/Other Studies Reviewed:    The following studies were reviewed today: I discussed my findings with the patient at length.   Recent Labs: 03/08/2019: Magnesium 1.9 07/06/2019: ALT 29; BUN 12; Creatinine, Ser 0.93; Hemoglobin 16.0; Platelets 237; Potassium 4.5; Sodium 137; TSH 0.988  Recent Lipid Panel    Component Value Date/Time   CHOL 163 07/06/2019 0918   TRIG 293 (H) 07/06/2019 0918   HDL 35 (L) 07/06/2019 0918   CHOLHDL 4.7 07/06/2019 0918   LDLCALC 80 07/06/2019 0918    Physical Exam:    VS:  BP 110/72   Pulse 72   Temp 97.6 F (36.4 C)   Ht 6\' 1"  (1.854 m)   Wt 256 lb 6.4 oz (116.3 kg)   SpO2 95%   BMI 33.83 kg/m     Wt Readings from Last 3 Encounters:  12/24/19 256 lb 6.4 oz (116.3 kg)  11/30/19 253 lb (114.8 kg)  09/24/19 267 lb (121.1 kg)     GEN: Patient is in no acute distress HEENT: Normal NECK: No JVD; No carotid bruits LYMPHATICS: No lymphadenopathy CARDIAC: Hear sounds regular, 2/6 systolic murmur at the apex. RESPIRATORY:  Clear to auscultation without rales, wheezing or rhonchi  ABDOMEN: Soft, non-tender, non-distended MUSCULOSKELETAL:  No edema; No deformity  SKIN: Warm and dry NEUROLOGIC:  Alert and oriented x 3 PSYCHIATRIC:  Normal affect   Signed, 09/26/19, MD  12/24/2019 8:25 AM    [facility]

## 2020-01-01 DIAGNOSIS — L732 Hidradenitis suppurativa: Secondary | ICD-10-CM | POA: Diagnosis not present

## 2020-01-01 DIAGNOSIS — I8310 Varicose veins of unspecified lower extremity with inflammation: Secondary | ICD-10-CM | POA: Diagnosis not present

## 2020-01-07 DIAGNOSIS — I739 Peripheral vascular disease, unspecified: Secondary | ICD-10-CM | POA: Diagnosis not present

## 2020-01-07 DIAGNOSIS — R600 Localized edema: Secondary | ICD-10-CM | POA: Diagnosis not present

## 2020-01-07 DIAGNOSIS — M129 Arthropathy, unspecified: Secondary | ICD-10-CM | POA: Diagnosis not present

## 2020-01-07 DIAGNOSIS — I872 Venous insufficiency (chronic) (peripheral): Secondary | ICD-10-CM | POA: Diagnosis not present

## 2020-02-07 ENCOUNTER — Other Ambulatory Visit: Payer: Self-pay | Admitting: Gastroenterology

## 2020-02-12 DIAGNOSIS — N400 Enlarged prostate without lower urinary tract symptoms: Secondary | ICD-10-CM | POA: Diagnosis not present

## 2020-02-12 DIAGNOSIS — E1140 Type 2 diabetes mellitus with diabetic neuropathy, unspecified: Secondary | ICD-10-CM | POA: Diagnosis not present

## 2020-02-12 DIAGNOSIS — R413 Other amnesia: Secondary | ICD-10-CM | POA: Diagnosis not present

## 2020-02-12 DIAGNOSIS — I10 Essential (primary) hypertension: Secondary | ICD-10-CM | POA: Diagnosis not present

## 2020-03-17 DIAGNOSIS — N400 Enlarged prostate without lower urinary tract symptoms: Secondary | ICD-10-CM | POA: Diagnosis not present

## 2020-03-17 DIAGNOSIS — E1140 Type 2 diabetes mellitus with diabetic neuropathy, unspecified: Secondary | ICD-10-CM | POA: Diagnosis not present

## 2020-03-17 DIAGNOSIS — K5909 Other constipation: Secondary | ICD-10-CM | POA: Diagnosis not present

## 2020-03-17 DIAGNOSIS — T63481A Toxic effect of venom of other arthropod, accidental (unintentional), initial encounter: Secondary | ICD-10-CM | POA: Diagnosis not present

## 2020-04-16 DIAGNOSIS — F339 Major depressive disorder, recurrent, unspecified: Secondary | ICD-10-CM | POA: Diagnosis not present

## 2020-04-16 DIAGNOSIS — M129 Arthropathy, unspecified: Secondary | ICD-10-CM | POA: Diagnosis not present

## 2020-04-16 DIAGNOSIS — E559 Vitamin D deficiency, unspecified: Secondary | ICD-10-CM | POA: Diagnosis not present

## 2020-04-16 DIAGNOSIS — R600 Localized edema: Secondary | ICD-10-CM | POA: Diagnosis not present

## 2020-04-22 DIAGNOSIS — E0500 Thyrotoxicosis with diffuse goiter without thyrotoxic crisis or storm: Secondary | ICD-10-CM | POA: Diagnosis not present

## 2020-04-22 DIAGNOSIS — Z79899 Other long term (current) drug therapy: Secondary | ICD-10-CM | POA: Diagnosis not present

## 2020-05-12 DIAGNOSIS — E559 Vitamin D deficiency, unspecified: Secondary | ICD-10-CM | POA: Diagnosis not present

## 2020-05-12 DIAGNOSIS — E1140 Type 2 diabetes mellitus with diabetic neuropathy, unspecified: Secondary | ICD-10-CM | POA: Diagnosis not present

## 2020-05-14 DIAGNOSIS — N400 Enlarged prostate without lower urinary tract symptoms: Secondary | ICD-10-CM | POA: Diagnosis not present

## 2020-05-14 DIAGNOSIS — I10 Essential (primary) hypertension: Secondary | ICD-10-CM | POA: Diagnosis not present

## 2020-05-14 DIAGNOSIS — E1140 Type 2 diabetes mellitus with diabetic neuropathy, unspecified: Secondary | ICD-10-CM | POA: Diagnosis not present

## 2020-05-14 DIAGNOSIS — R413 Other amnesia: Secondary | ICD-10-CM | POA: Diagnosis not present

## 2020-06-16 DIAGNOSIS — E1140 Type 2 diabetes mellitus with diabetic neuropathy, unspecified: Secondary | ICD-10-CM | POA: Diagnosis not present

## 2020-06-16 DIAGNOSIS — E785 Hyperlipidemia, unspecified: Secondary | ICD-10-CM | POA: Diagnosis not present

## 2020-06-16 DIAGNOSIS — R413 Other amnesia: Secondary | ICD-10-CM | POA: Diagnosis not present

## 2020-06-16 DIAGNOSIS — K5909 Other constipation: Secondary | ICD-10-CM | POA: Diagnosis not present

## 2020-06-23 ENCOUNTER — Other Ambulatory Visit: Payer: Self-pay | Admitting: Gastroenterology

## 2020-07-10 ENCOUNTER — Other Ambulatory Visit: Payer: Self-pay | Admitting: Gastroenterology

## 2020-08-06 DIAGNOSIS — E1140 Type 2 diabetes mellitus with diabetic neuropathy, unspecified: Secondary | ICD-10-CM | POA: Diagnosis not present

## 2020-08-06 DIAGNOSIS — I10 Essential (primary) hypertension: Secondary | ICD-10-CM | POA: Diagnosis not present

## 2020-08-06 DIAGNOSIS — N400 Enlarged prostate without lower urinary tract symptoms: Secondary | ICD-10-CM | POA: Diagnosis not present

## 2020-08-06 DIAGNOSIS — R413 Other amnesia: Secondary | ICD-10-CM | POA: Diagnosis not present

## 2020-08-12 ENCOUNTER — Other Ambulatory Visit: Payer: Self-pay

## 2020-08-12 DIAGNOSIS — F39 Unspecified mood [affective] disorder: Secondary | ICD-10-CM | POA: Insufficient documentation

## 2020-08-12 DIAGNOSIS — K922 Gastrointestinal hemorrhage, unspecified: Secondary | ICD-10-CM | POA: Insufficient documentation

## 2020-08-12 DIAGNOSIS — I739 Peripheral vascular disease, unspecified: Secondary | ICD-10-CM | POA: Insufficient documentation

## 2020-08-12 DIAGNOSIS — I10 Essential (primary) hypertension: Secondary | ICD-10-CM | POA: Insufficient documentation

## 2020-08-12 DIAGNOSIS — N529 Male erectile dysfunction, unspecified: Secondary | ICD-10-CM | POA: Insufficient documentation

## 2020-08-12 DIAGNOSIS — J449 Chronic obstructive pulmonary disease, unspecified: Secondary | ICD-10-CM | POA: Insufficient documentation

## 2020-08-12 DIAGNOSIS — I700 Atherosclerosis of aorta: Secondary | ICD-10-CM | POA: Insufficient documentation

## 2020-08-12 DIAGNOSIS — Z8673 Personal history of transient ischemic attack (TIA), and cerebral infarction without residual deficits: Secondary | ICD-10-CM | POA: Insufficient documentation

## 2020-08-12 DIAGNOSIS — Z8719 Personal history of other diseases of the digestive system: Secondary | ICD-10-CM | POA: Insufficient documentation

## 2020-08-12 DIAGNOSIS — E559 Vitamin D deficiency, unspecified: Secondary | ICD-10-CM | POA: Insufficient documentation

## 2020-08-12 DIAGNOSIS — K5090 Crohn's disease, unspecified, without complications: Secondary | ICD-10-CM | POA: Insufficient documentation

## 2020-08-13 ENCOUNTER — Other Ambulatory Visit: Payer: Self-pay

## 2020-08-13 ENCOUNTER — Ambulatory Visit: Payer: BC Managed Care – PPO | Admitting: Cardiology

## 2020-08-13 ENCOUNTER — Encounter: Payer: Self-pay | Admitting: Cardiology

## 2020-08-13 VITALS — BP 120/72 | HR 71 | Ht 73.0 in | Wt 237.0 lb

## 2020-08-13 DIAGNOSIS — F17210 Nicotine dependence, cigarettes, uncomplicated: Secondary | ICD-10-CM | POA: Diagnosis not present

## 2020-08-13 DIAGNOSIS — I2510 Atherosclerotic heart disease of native coronary artery without angina pectoris: Secondary | ICD-10-CM

## 2020-08-13 DIAGNOSIS — E782 Mixed hyperlipidemia: Secondary | ICD-10-CM | POA: Diagnosis not present

## 2020-08-13 DIAGNOSIS — I10 Essential (primary) hypertension: Secondary | ICD-10-CM | POA: Diagnosis not present

## 2020-08-13 NOTE — Patient Instructions (Signed)
Medication Instructions:  Your physician recommends that you continue on your current medications as directed. Please refer to the Current Medication list given to you today.  *If you need a refill on your cardiac medications before your next appointment, please call your pharmacy*   Lab Work: NONE If you have labs (blood work) drawn today and your tests are completely normal, you will receive your results only by: MyChart Message (if you have MyChart) OR A paper copy in the mail If you have any lab test that is abnormal or we need to change your treatment, we will call you to review the results.   Testing/Procedures: EKG   Follow-Up: At [facility], you and your health needs are our priority.  As part of our continuing mission to provide you with exceptional heart care, we have created designated Provider Care Teams.  These Care Teams include your primary Cardiologist (physician) and Advanced Practice Providers (APPs -  Physician Assistants and Nurse Practitioners) who all work together to provide you with the care you need, when you need it.  We recommend signing up for the patient portal called "MyChart".  Sign up information is provided on this After Visit Summary.  MyChart is used to connect with patients for Virtual Visits (Telemedicine).  Patients are able to view lab/test results, encounter notes, upcoming appointments, etc.  Non-urgent messages can be sent to your provider as well.   To learn more about what you can do with MyChart, go to [email or web address].    Your next appointment:   6 month(s)  The format for your next appointment:   In Person  Provider:   Robert Krasowski, MD   Other Instructions   

## 2020-08-13 NOTE — Progress Notes (Signed)
Cardiology Office Note:    Date:  08/13/2020   ID:  Calvin Wilkerson, DOB 01/04/1966, MRN [id number]  PCP:  Calvin Lei, MD  Cardiologist:  Calvin Brothers, MD   Referring MD: Calvin Lei, MD    ASSESSMENT:    1. Coronary artery disease involving native coronary artery of native heart without angina pectoris   2. Mixed hyperlipidemia   3. HTN (hypertension), benign   4. Cigarette smoker    PLAN:    In order of problems listed above:  1. Coronary artery disease: Stable at this time from a symptomatic standpoint.  He is active and ambulating well without any problems.  He will continue his current medications and secondary prevention stressed. 2. Essential hypertension: Blood pressure stable and diet was emphasized. 3. Mixed dyslipidemia: Lipids followed by primary care physician.  We do not have copy of lab work.  He was told about 5.  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  Lab work from this patient has been under my care in my previous practice. KPN sheet was reviewed and discussed with patient and questions were answered to satisfaction 4. Patient will be seen in follow-up appointment in 6 months or earlier if the patient has any concerns    Medication Adjustments/Labs and Tests Ordered: Current medicines are reviewed at length with the patient today.  Concerns regarding medicines are outlined above.  No orders of the defined types were placed in this encounter.  No orders of the defined types were placed in this encounter.    Chief Complaint  Patient presents with  . Follow-up     History of Present Illness:    Calvin Wilkerson is a 55 y.o. male.  Patient has past medical history of coronary artery disease, essential hypertension dyslipidemia and COPD.  He denies any problems at this time and takes care of activities of daily living.  He mentions to me that he walks on a regular basis without  much of a problem.  He has gastrointestinal issues which are causing more of a problem and is on antibiotic and getting better.  At the time of my evaluation, the patient is alert awake oriented and in no distress.  Past Medical History:  Diagnosis Date  . Abnormal stress test showing ischemia involving inferior wall 02/19/2019  . Angina pectoris (HCC) 03/09/2019  . Aortic atherosclerosis (HCC)   . CAD (coronary artery disease) 07/06/2019  . Cellulitis of right knee 07/12/2016  . Cigarette smoker 01/15/2019  . COPD (chronic obstructive pulmonary disease) (HCC)   . Crohn's disease (HCC)   . DOE (dyspnea on exertion) 01/15/2019  . Erectile dysfunction   . GERD (gastroesophageal reflux disease)   . GI bleed   . History of diverticulitis   . History of recurrent TIAs   . HLD (hyperlipidemia) 12/24/2016  . HTN (hypertension), benign 12/24/2016  . Hypertension   . Hyperthyroidism   . Incisional hernia without obstruction or gangrene 12/31/2016  . Mood disorder (HCC)   . PVD (peripheral vascular disease) (HCC)   . S/P hernia repair 12/16/2016   Added automatically from request for surgery [phone]  Formatting of this note might be different from the original. Added automatically from request for surgery [phone]  . Status post laparoscopy 12/2016   removed mesh from previous hernia repair that was causing a strangulated small intestine.  Marland Kitchen TIA (transient ischemic  attack) 09/05/2015  . Vitamin D deficiency     Past Surgical History:  Procedure Laterality Date  . APPENDECTOMY    . COLECTOMY    . COLONOSCOPY  01/2016   Colonic polyp status post polypectomy. Few erosions in the TI consistent with history of Crohns Disease. Mild pancolonic diverticulosis. Internal hemorrhoids.   . COLOSTOMY REVERSAL    . CORONARY STENT INTERVENTION N/A 03/16/2019   Procedure: CORONARY STENT INTERVENTION;  Surgeon: Tonny Bollman, MD;  Location: [facility];  Service: Cardiovascular;  Laterality: N/A;  . [place]     . HERNIA REPAIR    . LEFT HEART CATH AND CORONARY ANGIOGRAPHY N/A 03/16/2019   Procedure: LEFT HEART CATH AND CORONARY ANGIOGRAPHY;  Surgeon: Tonny Bollman, MD;  Location: [facility];  Service: Cardiovascular;  Laterality: N/A;  . UPPER GI ENDOSCOPY  2011   Healed esophageal erosions. Mild gastritis.     Current Medications: Current Meds  Medication Sig  . acetaminophen (TYLENOL) 500 MG tablet Take 1,000 mg by mouth every 6 (six) hours as needed (pain).   Marland Kitchen albuterol (VENTOLIN HFA) 108 (90 Base) MCG/ACT inhaler 2 puffs daily.   Marland Kitchen aspirin EC 81 MG tablet Take 81 mg by mouth daily.  Marland Kitchen atorvastatin (LIPITOR) 20 MG tablet Take 20 mg by mouth daily.  . baclofen (LIORESAL) 10 MG tablet Take 10 mg by mouth at bedtime.  Marland Kitchen buPROPion (WELLBUTRIN XL) 150 MG 24 hr tablet Take 150 mg by mouth daily.  . clopidogrel (PLAVIX) 75 MG tablet TAKE 1 TABLET BY MOUTH DAILY  . dicyclomine (BENTYL) 10 MG capsule TAKE 1 CAPSULE BY MOUTH 4 TIMES DAILY BEFORE MEALS AND AT BEDTIME (APPOINTMENT NEEDED FOR FUTURE REFILLS)  . docusate sodium (COLACE) 100 MG capsule Take 100 mg by mouth daily.  Marland Kitchen donepezil (ARICEPT) 10 MG tablet Take 10 mg by mouth daily.  Marland Kitchen doxycycline (VIBRA-TABS) 100 MG tablet Take 1 tablet by mouth daily.  Marland Kitchen EPINEPHrine 0.3 mg/0.3 mL IJ SOAJ injection Inject into the muscle.  . fluticasone (FLONASE) 50 MCG/ACT nasal spray Place 1 spray into both nostrils daily.   . furosemide (LASIX) 20 MG tablet Take 40 mg by mouth daily.   Marland Kitchen gabapentin (NEURONTIN) 300 MG capsule Take 300 mg by mouth 2 (two) times daily as needed (pain).   Marland Kitchen losartan-hydrochlorothiazide (HYZAAR) 100-25 MG tablet Take 1 tablet by mouth daily.  . metFORMIN (GLUCOPHAGE-XR) 500 MG 24 hr tablet Take 500 mg by mouth 2 (two) times daily.  . methimazole (TAPAZOLE) 5 MG tablet Take 5 mg by mouth daily.  . montelukast (SINGULAIR) 10 MG tablet Take 10 mg by mouth at bedtime.  . Multiple Vitamins-Minerals (MULTIVITAMIN WITH  MINERALS) tablet Take 1 tablet by mouth daily.  . nitroGLYCERIN (NITROSTAT) 0.4 MG SL tablet Place 1 tablet (0.4 mg total) under the tongue every 5 (five) minutes as needed.  . pantoprazole (PROTONIX) 40 MG tablet Take 1 tablet (40 mg total) by mouth daily. Please call [phone] to schedule an office visit for more refills.  . predniSONE (STERAPRED UNI-PAK 48 TAB) 10 MG (48) TBPK tablet Take by mouth as directed.  . pregabalin (LYRICA) 75 MG capsule Take 75 mg by mouth 2 (two) times daily.  Marland Kitchen sulfamethoxazole-trimethoprim (BACTRIM DS) 800-160 MG tablet Take 1 tablet by mouth 2 (two) times daily.  . tamsulosin (FLOMAX) 0.4 MG CAPS capsule Take 0.4 mg by mouth daily.  . Vitamin D, Ergocalciferol, (DRISDOL) 50000 units CAPS capsule Take 50,000 Units by mouth every  7 (seven) days.     Allergies:   Patient has no known allergies.   Social History   Socioeconomic History  . Marital status: Married    Spouse name: Not on file  . Number of children: Not on file  . Years of education: Not on file  . Highest education level: Not on file  Occupational History  . Not on file  Tobacco Use  . Smoking status: Former Smoker    Packs/day: 1.50    Quit date: 09/03/2019    Years since quitting: 0.9  . Smokeless tobacco: Never Used  Vaping Use  . Vaping Use: Former  Substance and Sexual Activity  . Alcohol use: Not Currently  . Drug use: Never  . Sexual activity: Not on file  Other Topics Concern  . Not on file  Social History Narrative  . Not on file   Social Determinants of Health   Financial Resource Strain: Not on file  Food Insecurity: Not on file  Transportation Needs: Not on file  Physical Activity: Not on file  Stress: Not on file  Social Connections: Not on file     Family History: The patient's family history includes Aneurysm in his brother and mother; Bone cancer in his brother and maternal grandmother; Breast cancer in his sister; Hypertension in his mother; Hypothyroidism  in his mother.  ROS:   Please see the history of present illness.    All other systems reviewed and are negative.  EKGs/Labs/Other Studies Reviewed:    The following studies were reviewed today: I discussed my findings with the patient at length   Recent Labs: No results found for requested labs within last 8760 hours.  Recent Lipid Panel    Component Value Date/Time   CHOL 163 07/06/2019 0918   TRIG 293 (H) 07/06/2019 0918   HDL 35 (L) 07/06/2019 0918   CHOLHDL 4.7 07/06/2019 0918   LDLCALC 80 07/06/2019 0918    Physical Exam:    VS:  BP 120/72 (BP Location: Left Arm, Patient Position: Sitting, Cuff Size: Normal)   Pulse 71   Ht 6\' 1"  (1.854 m)   Wt 237 lb (107.5 kg)   SpO2 98%   BMI 31.27 kg/m     Wt Readings from Last 3 Encounters:  08/13/20 237 lb (107.5 kg)  12/24/19 256 lb 6.4 oz (116.3 kg)  11/30/19 253 lb (114.8 kg)     GEN: Patient is in no acute distress HEENT: Normal NECK: No JVD; No carotid bruits LYMPHATICS: No lymphadenopathy CARDIAC: Hear sounds regular, 2/6 systolic murmur at the apex. RESPIRATORY:  Clear to auscultation without rales, wheezing or rhonchi  ABDOMEN: Soft, non-tender, non-distended MUSCULOSKELETAL:  No edema; No deformity  SKIN: Warm and dry NEUROLOGIC:  Alert and oriented x 3 PSYCHIATRIC:  Normal affect   Signed, 12/02/19, MD  08/13/2020 4:19 PM    [facility]

## 2020-09-09 DIAGNOSIS — E1140 Type 2 diabetes mellitus with diabetic neuropathy, unspecified: Secondary | ICD-10-CM | POA: Diagnosis not present

## 2020-09-09 DIAGNOSIS — I10 Essential (primary) hypertension: Secondary | ICD-10-CM | POA: Diagnosis not present

## 2020-09-09 DIAGNOSIS — R413 Other amnesia: Secondary | ICD-10-CM | POA: Diagnosis not present

## 2020-09-09 DIAGNOSIS — N400 Enlarged prostate without lower urinary tract symptoms: Secondary | ICD-10-CM | POA: Diagnosis not present

## 2020-10-21 DIAGNOSIS — Z79899 Other long term (current) drug therapy: Secondary | ICD-10-CM | POA: Diagnosis not present

## 2020-10-21 DIAGNOSIS — E0500 Thyrotoxicosis with diffuse goiter without thyrotoxic crisis or storm: Secondary | ICD-10-CM | POA: Diagnosis not present

## 2020-11-07 ENCOUNTER — Telehealth: Payer: Self-pay | Admitting: Cardiology

## 2020-11-07 MED ORDER — CLOPIDOGREL BISULFATE 75 MG PO TABS: 1.0000 | ORAL_TABLET | Freq: Every day | ORAL | 3 refills | Status: DC

## 2020-11-07 NOTE — Telephone Encounter (Signed)
Refill sent in per request.  

## 2020-11-07 NOTE — Telephone Encounter (Signed)
*  STAT* If patient is at the pharmacy, call can be transferred to refill team.   1. Which medications need to be refilled? (please list name of each medication and dose if known) need new prescription for Clopidogrel 75 mg-  2. Which pharmacy/location (including street and city if local pharmacy) is medication to be sent to? [facility] [place]  3. Do they need a 30 day or 90 day supply? 90 days

## 2020-12-08 DIAGNOSIS — N400 Enlarged prostate without lower urinary tract symptoms: Secondary | ICD-10-CM | POA: Diagnosis not present

## 2020-12-08 DIAGNOSIS — R413 Other amnesia: Secondary | ICD-10-CM | POA: Diagnosis not present

## 2020-12-08 DIAGNOSIS — I10 Essential (primary) hypertension: Secondary | ICD-10-CM | POA: Diagnosis not present

## 2020-12-08 DIAGNOSIS — E1140 Type 2 diabetes mellitus with diabetic neuropathy, unspecified: Secondary | ICD-10-CM | POA: Diagnosis not present

## 2020-12-09 DIAGNOSIS — Z79899 Other long term (current) drug therapy: Secondary | ICD-10-CM | POA: Diagnosis not present

## 2020-12-09 DIAGNOSIS — E1140 Type 2 diabetes mellitus with diabetic neuropathy, unspecified: Secondary | ICD-10-CM | POA: Diagnosis not present

## 2020-12-09 DIAGNOSIS — E785 Hyperlipidemia, unspecified: Secondary | ICD-10-CM | POA: Diagnosis not present

## 2021-03-06 ENCOUNTER — Ambulatory Visit: Payer: BC Managed Care – PPO | Admitting: Cardiology

## 2021-03-06 ENCOUNTER — Other Ambulatory Visit: Payer: Self-pay

## 2021-03-09 ENCOUNTER — Ambulatory Visit: Payer: BC Managed Care – PPO | Admitting: Cardiology

## 2021-03-09 ENCOUNTER — Other Ambulatory Visit: Payer: Self-pay

## 2021-03-09 ENCOUNTER — Encounter: Payer: Self-pay | Admitting: Cardiology

## 2021-03-09 VITALS — BP 116/72 | HR 68 | Ht 73.0 in | Wt 229.4 lb

## 2021-03-09 DIAGNOSIS — I700 Atherosclerosis of aorta: Secondary | ICD-10-CM

## 2021-03-09 DIAGNOSIS — F17210 Nicotine dependence, cigarettes, uncomplicated: Secondary | ICD-10-CM

## 2021-03-09 DIAGNOSIS — E782 Mixed hyperlipidemia: Secondary | ICD-10-CM

## 2021-03-09 DIAGNOSIS — I209 Angina pectoris, unspecified: Secondary | ICD-10-CM

## 2021-03-09 DIAGNOSIS — I2510 Atherosclerotic heart disease of native coronary artery without angina pectoris: Secondary | ICD-10-CM | POA: Diagnosis not present

## 2021-03-09 MED ORDER — LOSARTAN POTASSIUM 100 MG PO TABS: 100.0000 mg | ORAL_TABLET | Freq: Every day | ORAL | 3 refills | Status: DC

## 2021-03-09 NOTE — Progress Notes (Signed)
Cardiology Office Note:    Date:  03/09/2021   ID:  Calvin Wilkerson, DOB November 14, 1965, MRN [id number]  PCP:  Lucianne Lei, MD  Cardiologist:  Garwin Brothers, MD   Referring MD: Lucianne Lei, MD    ASSESSMENT:    1. Angina pectoris (HCC)   2. Aortic atherosclerosis (HCC)   3. Coronary artery disease involving native coronary artery of native heart without angina pectoris   4. Cigarette smoker   5. Mixed hyperlipidemia    PLAN:    In order of problems listed above:  The artery disease: Secondary prevention stressed with the patient.  Importance of compliance with diet medication stressed any vocalized understanding.  He was advised to walk at least half an hour a day 5 days a week and he promises to do so. Essential hypertension: Blood pressure stable and diet was emphasized.  He mentions to me that he feels tired at the end of the day and his blood pressure drops later in the evening.  I told him to hold off on Hyzaar and started him only on losartan with the same dose that he is taking now.  In essence we will stop the hydrochlorothiazide.  He will monitor his blood pressures.  He will get all blood work done by his primary care on Wednesday when he sees his doctor.  He will keep [place] updated about his blood pressure logs. Mixed dyslipidemia and diabetes melitis: Diet was emphasized.  Lifestyle modification urged.  He has abdominal obesity and risks emphasized.  He promises to do better. Cigarette smoker: I spent 5 minutes with the patient discussing solely about smoking. Smoking cessation was counseled. I suggested to the patient also different medications and pharmacological interventions. Patient is keen to try stopping on its own at this time. He will get back to me if he needs any further assistance in this matter. Patient will be seen in follow-up appointment in 6 months or earlier if the patient has any concerns    Medication Adjustments/Labs and Tests Ordered: Current medicines are  reviewed at length with the patient today.  Concerns regarding medicines are outlined above.  No orders of the defined types were placed in this encounter.  No orders of the defined types were placed in this encounter.    No chief complaint on file.    History of Present Illness:    Calvin Wilkerson is a 55 y.o. male.  Patient has past medical history of coronary artery disease post stenting, essential hypertension, mixed dyslipidemia and diabetes mellitus.  Unfortunately continues to smoke.  He denies any chest pain orthopnea or PND.  Overall he leads a sedentary lifestyle.  At the time of my evaluation, the patient is alert awake oriented and in no distress.  Past Medical History:  Diagnosis Date   Abnormal stress test showing ischemia involving inferior wall 02/19/2019   Angina pectoris (HCC) 03/09/2019   Aortic atherosclerosis (HCC)    CAD (coronary artery disease) 07/06/2019   Cellulitis of right knee 07/12/2016   Cigarette smoker 01/15/2019   COPD (chronic obstructive pulmonary disease) (HCC)    Crohn's disease (HCC)    DOE (dyspnea on exertion) 01/15/2019   Erectile dysfunction    GERD (gastroesophageal reflux disease)    GI bleed    History of diverticulitis    History of recurrent TIAs    HLD (hyperlipidemia) 12/24/2016   HTN (hypertension), benign 12/24/2016   Hypertension    Hyperthyroidism    Incisional hernia without obstruction  or gangrene 12/31/2016   Mood disorder (HCC)    PVD (peripheral vascular disease) (HCC)    S/P hernia repair 12/16/2016   Added automatically from request for surgery [id number]  Formatting of this note might be different from the original. Added automatically from request for surgery [id number]   Status post laparoscopy 12/2016   removed mesh from previous hernia repair that was causing a strangulated small intestine.   TIA (transient ischemic attack) 09/05/2015   Vitamin D deficiency     Past Surgical History:  Procedure Laterality Date   APPENDECTOMY      COLECTOMY     COLONOSCOPY  01/2016   Colonic polyp status post polypectomy. Few erosions in the TI consistent with history of Crohns Disease. Mild pancolonic diverticulosis. Internal hemorrhoids.    COLOSTOMY REVERSAL     CORONARY STENT INTERVENTION N/A 03/16/2019   Procedure: CORONARY STENT INTERVENTION;  Surgeon: Tonny Bollman, MD;  Location: [facility] INVASIVE CV LAB;  Service: Cardiovascular;  Laterality: N/A;   [place]     HERNIA REPAIR     LEFT HEART CATH AND CORONARY ANGIOGRAPHY N/A 03/16/2019   Procedure: LEFT HEART CATH AND CORONARY ANGIOGRAPHY;  Surgeon: Tonny Bollman, MD;  Location: [facility] INVASIVE CV LAB;  Service: Cardiovascular;  Laterality: N/A;   UPPER GI ENDOSCOPY  2011   Healed esophageal erosions. Mild gastritis.     Current Medications: Current Meds  Medication Sig   acetaminophen (TYLENOL) 500 MG tablet Take 1,000 mg by mouth every 6 (six) hours as needed for pain (pain).   albuterol (VENTOLIN HFA) 108 (90 Base) MCG/ACT inhaler Inhale 2 puffs into the lungs daily.   aspirin EC 81 MG tablet Take 81 mg by mouth daily.   atorvastatin (LIPITOR) 20 MG tablet Take 20 mg by mouth daily.   baclofen (LIORESAL) 10 MG tablet Take 10 mg by mouth at bedtime.   buPROPion (WELLBUTRIN XL) 150 MG 24 hr tablet Take 150 mg by mouth daily.   clopidogrel (PLAVIX) 75 MG tablet Take 1 tablet (75 mg total) by mouth daily.   dicyclomine (BENTYL) 10 MG capsule TAKE 1 CAPSULE BY MOUTH 4 TIMES DAILY BEFORE MEALS AND AT BEDTIME (APPOINTMENT NEEDED FOR FUTURE REFILLS)   docusate sodium (COLACE) 100 MG capsule Take 100 mg by mouth daily.   donepezil (ARICEPT) 10 MG tablet Take 10 mg by mouth daily.   EPINEPHrine 0.3 mg/0.3 mL IJ SOAJ injection Inject 0.3 mg into the muscle as needed for anaphylaxis.   fluticasone (FLONASE) 50 MCG/ACT nasal spray Place 1 spray into both nostrils daily.    furosemide (LASIX) 20 MG tablet Take 40 mg by mouth daily.    gabapentin (NEURONTIN) 300 MG capsule Take 300 mg by  mouth 2 (two) times daily as needed for pain (pain).   metFORMIN (GLUCOPHAGE-XR) 500 MG 24 hr tablet Take 500 mg by mouth 2 (two) times daily.   methimazole (TAPAZOLE) 5 MG tablet Take 5 mg by mouth daily.   montelukast (SINGULAIR) 10 MG tablet Take 10 mg by mouth at bedtime.   Multiple Vitamins-Minerals (MULTIVITAMIN WITH MINERALS) tablet Take 1 tablet by mouth daily.   nitroGLYCERIN (NITROSTAT) 0.4 MG SL tablet Place 0.4 mg under the tongue every 5 (five) minutes as needed for chest pain.   pantoprazole (PROTONIX) 40 MG tablet Take 1 tablet (40 mg total) by mouth daily. Please call [phone] to schedule an office visit for more refills.   tamsulosin (FLOMAX) 0.4 MG CAPS capsule Take 0.4 mg by mouth daily.  Vitamin D, Ergocalciferol, (DRISDOL) 50000 units CAPS capsule Take 50,000 Units by mouth every 7 (seven) days.   [DISCONTINUED] losartan-hydrochlorothiazide (HYZAAR) 100-25 MG tablet Take 1 tablet by mouth daily.     Allergies:   Patient has no known allergies.   Social History   Socioeconomic History   Marital status: Married    Spouse name: Not on file   Number of children: Not on file   Years of education: Not on file   Highest education level: Not on file  Occupational History   Not on file  Tobacco Use   Smoking status: Former    Packs/day: 1.50    Types: Cigarettes    Quit date: 09/03/2019    Years since quitting: 1.5   Smokeless tobacco: Never  Vaping Use   Vaping Use: Former  Substance and Sexual Activity   Alcohol use: Not Currently   Drug use: Never   Sexual activity: Not on file  Other Topics Concern   Not on file  Social History Narrative   Not on file   Social Determinants of Health   Financial Resource Strain: Not on file  Food Insecurity: Not on file  Transportation Needs: Not on file  Physical Activity: Not on file  Stress: Not on file  Social Connections: Not on file     Family History: The patient's family history includes Aneurysm in his  brother and mother; Bone cancer in his brother and maternal grandmother; Breast cancer in his sister; Hypertension in his mother; Hypothyroidism in his mother.  ROS:   Please see the history of present illness.    All other systems reviewed and are negative.  EKGs/Labs/Other Studies Reviewed:    The following studies were reviewed today: I discussed my findings with the patient at length.   Recent Labs: No results found for requested labs within last 8760 hours.  Recent Lipid Panel    Component Value Date/Time   CHOL 163 07/06/2019 0918   TRIG 293 (H) 07/06/2019 0918   HDL 35 (L) 07/06/2019 0918   CHOLHDL 4.7 07/06/2019 0918   LDLCALC 80 07/06/2019 0918    Physical Exam:    VS:  BP 116/72   Pulse 68   Ht 6\' 1"  (1.854 m)   Wt 229 lb 6.4 oz (104.1 kg)   SpO2 95%   BMI 30.27 kg/m     Wt Readings from Last 3 Encounters:  03/09/21 229 lb 6.4 oz (104.1 kg)  08/13/20 237 lb (107.5 kg)  12/24/19 256 lb 6.4 oz (116.3 kg)     GEN: Patient is in no acute distress HEENT: Normal NECK: No JVD; No carotid bruits LYMPHATICS: No lymphadenopathy CARDIAC: Hear sounds regular, 2/6 systolic murmur at the apex. RESPIRATORY:  Clear to auscultation without rales, wheezing or rhonchi  ABDOMEN: Soft, non-tender, non-distended MUSCULOSKELETAL:  No edema; No deformity  SKIN: Warm and dry NEUROLOGIC:  Alert and oriented x 3 PSYCHIATRIC:  Normal affect   Signed, 12/26/19, MD  03/09/2021 4:25 PM    [facility]

## 2021-03-09 NOTE — Patient Instructions (Signed)
Medication Instructions:  Your physician has recommended you make the following change in your medication:   Stop Plavix (Clopidogrel) Stop Losartan/HCTZ Start Losartan 100 mg daily.  *If you need a refill on your cardiac medications before your next appointment, please call your pharmacy*   Lab Work: None ordered If you have labs (blood work) drawn today and your tests are completely normal, you will receive your results only by: MyChart Message (if you have MyChart) OR A paper copy in the mail If you have any lab test that is abnormal or we need to change your treatment, we will call you to review the results.   Testing/Procedures: None ordered   Follow-Up: At [facility], you and your health needs are our priority.  As part of our continuing mission to provide you with exceptional heart care, we have created designated Provider Care Teams.  These Care Teams include your primary Cardiologist (physician) and Advanced Practice Providers (APPs -  Physician Assistants and Nurse Practitioners) who all work together to provide you with the care you need, when you need it.  We recommend signing up for the patient portal called "MyChart".  Sign up information is provided on this After Visit Summary.  MyChart is used to connect with patients for Virtual Visits (Telemedicine).  Patients are able to view lab/test results, encounter notes, upcoming appointments, etc.  Non-urgent messages can be sent to your provider as well.   To learn more about what you can do with MyChart, go to [email or web address].    Your next appointment:   6 month(s)  The format for your next appointment:   In Person  Provider:   Belva Crome, MD   Other Instructions NA

## 2021-03-11 DIAGNOSIS — N400 Enlarged prostate without lower urinary tract symptoms: Secondary | ICD-10-CM | POA: Diagnosis not present

## 2021-03-11 DIAGNOSIS — I10 Essential (primary) hypertension: Secondary | ICD-10-CM | POA: Diagnosis not present

## 2021-03-11 DIAGNOSIS — E1140 Type 2 diabetes mellitus with diabetic neuropathy, unspecified: Secondary | ICD-10-CM | POA: Diagnosis not present

## 2021-03-11 DIAGNOSIS — E785 Hyperlipidemia, unspecified: Secondary | ICD-10-CM | POA: Diagnosis not present

## 2021-03-31 DIAGNOSIS — M25511 Pain in right shoulder: Secondary | ICD-10-CM | POA: Diagnosis not present

## 2021-03-31 DIAGNOSIS — M19011 Primary osteoarthritis, right shoulder: Secondary | ICD-10-CM | POA: Diagnosis not present

## 2021-03-31 DIAGNOSIS — M25512 Pain in left shoulder: Secondary | ICD-10-CM | POA: Diagnosis not present

## 2021-04-02 DIAGNOSIS — M25519 Pain in unspecified shoulder: Secondary | ICD-10-CM | POA: Diagnosis not present

## 2021-04-02 DIAGNOSIS — Z6830 Body mass index (BMI) 30.0-30.9, adult: Secondary | ICD-10-CM | POA: Diagnosis not present

## 2021-04-02 DIAGNOSIS — R001 Bradycardia, unspecified: Secondary | ICD-10-CM | POA: Diagnosis not present

## 2021-04-02 DIAGNOSIS — K219 Gastro-esophageal reflux disease without esophagitis: Secondary | ICD-10-CM | POA: Diagnosis not present

## 2021-04-23 DIAGNOSIS — Z79899 Other long term (current) drug therapy: Secondary | ICD-10-CM | POA: Diagnosis not present

## 2021-04-23 DIAGNOSIS — E0500 Thyrotoxicosis with diffuse goiter without thyrotoxic crisis or storm: Secondary | ICD-10-CM | POA: Diagnosis not present

## 2021-05-06 ENCOUNTER — Encounter: Payer: Self-pay | Admitting: Gastroenterology

## 2021-05-08 DIAGNOSIS — J449 Chronic obstructive pulmonary disease, unspecified: Secondary | ICD-10-CM | POA: Diagnosis not present

## 2021-05-08 DIAGNOSIS — E785 Hyperlipidemia, unspecified: Secondary | ICD-10-CM | POA: Diagnosis not present

## 2021-05-08 DIAGNOSIS — E1140 Type 2 diabetes mellitus with diabetic neuropathy, unspecified: Secondary | ICD-10-CM | POA: Diagnosis not present

## 2021-05-08 DIAGNOSIS — M129 Arthropathy, unspecified: Secondary | ICD-10-CM | POA: Diagnosis not present

## 2021-05-23 IMAGING — CT CT ABD-PELV W/ CM
2 of 5 series · 17 of 46 positions shown, 19 images · IV contrast (APPLIED)
Comparison: Abdomen only CT on 12/01/2017

CLINICAL DATA: Chronic abdominal pain. Previous appendectomy,
colostomy, and hernia repairs.

EXAM:
CT ABDOMEN AND PELVIS WITH CONTRAST
TECHNIQUE: Multidetector CT imaging of the abdomen and pelvis was performed
using the standard protocol following bolus administration of
intravenous contrast.
CONTRAST:  100mL OMNIPAQUE IOHEXOL 300 MG/ML  SOLN

[Series 2: axial st · axial · 0.98mm/px · z∈[+778,+1228]mm · 14 of 102 slices shown, 16 images]
[im 6/102  soft-tissue]
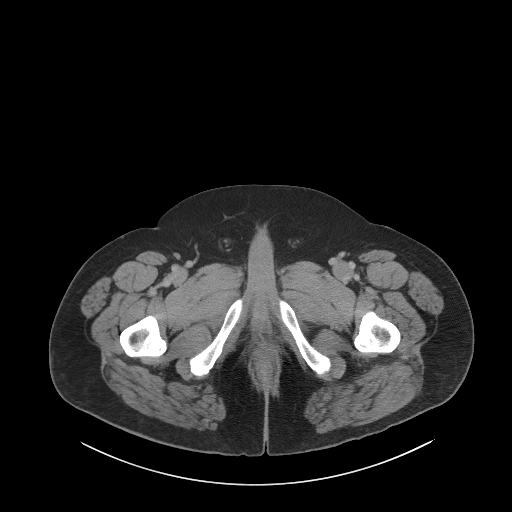
[im 6/102  bone]
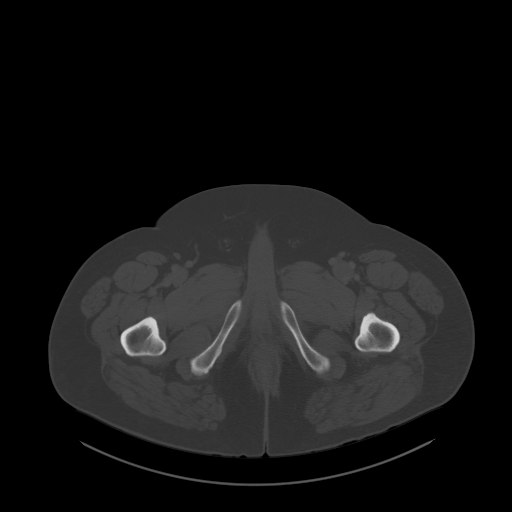
[im 11/102  soft-tissue]
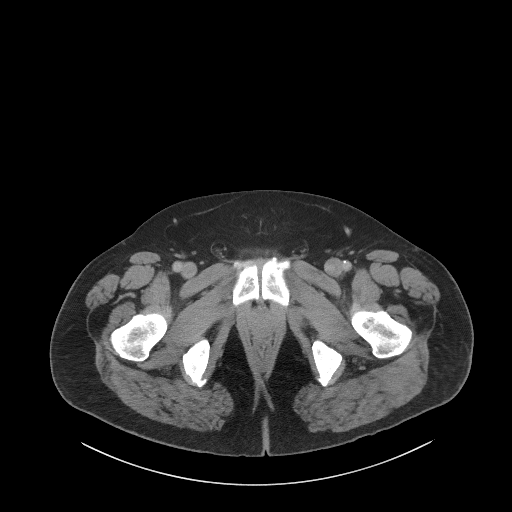
[im 22/102  soft-tissue]
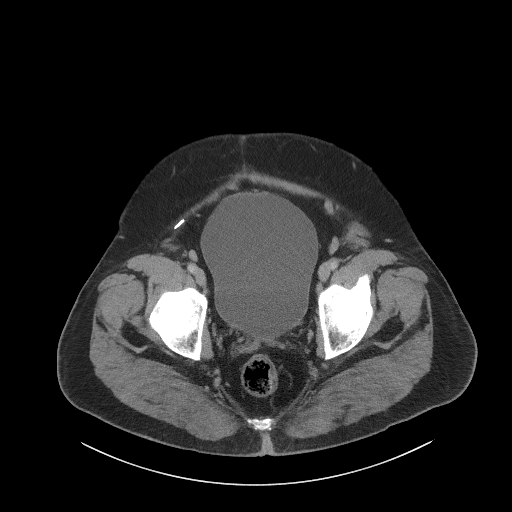
[im 27/102  soft-tissue]
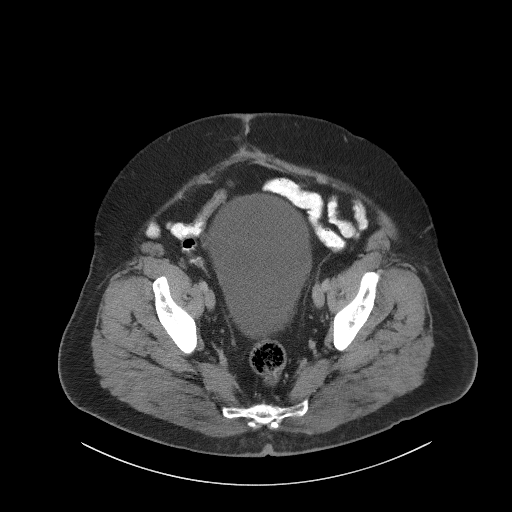
[im 32/102  soft-tissue]
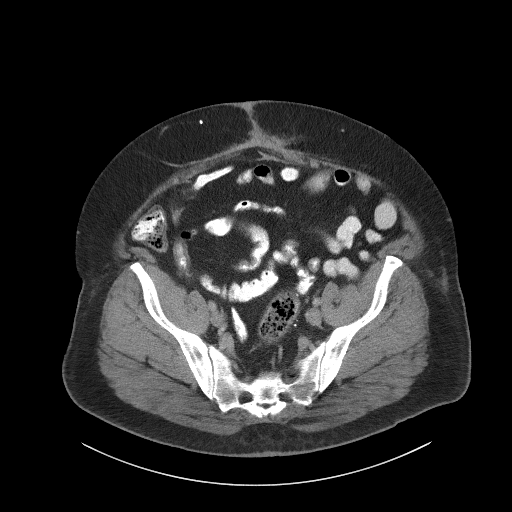
[im 43/102  soft-tissue]
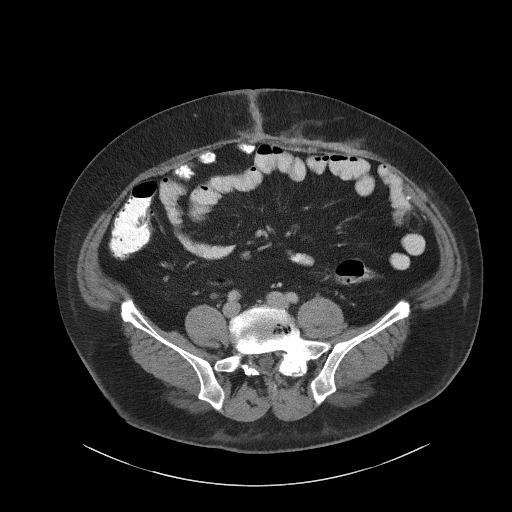
[im 48/102  soft-tissue]
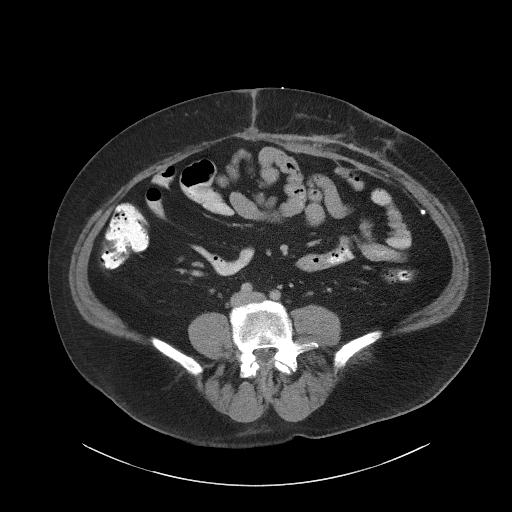
[im 54/102  soft-tissue]
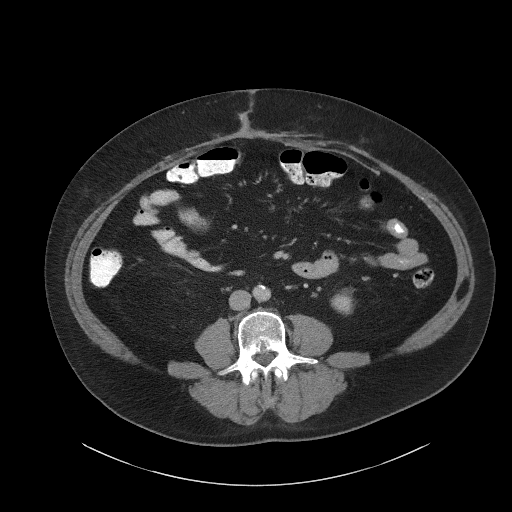
[im 59/102  soft-tissue]
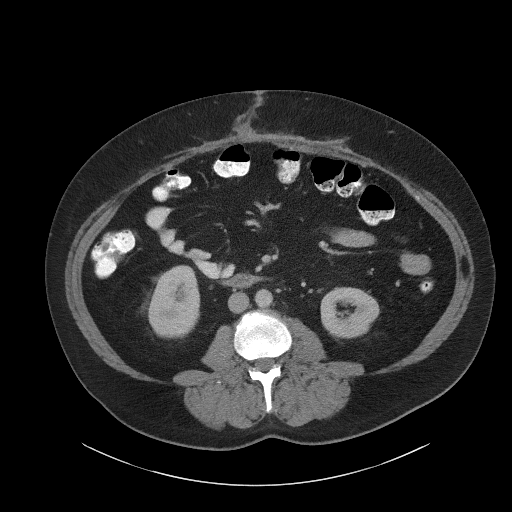
[im 59/102  bone]
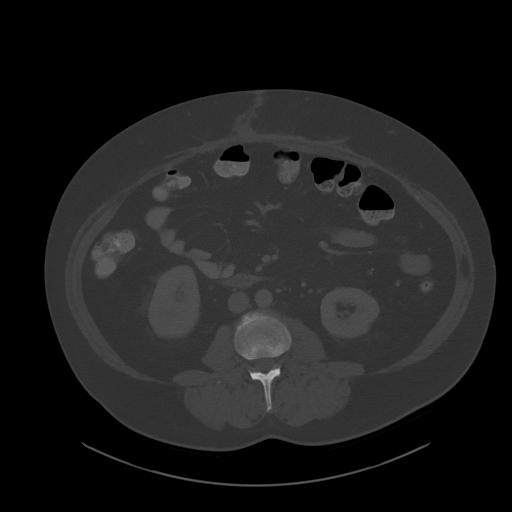
[im 70/102  soft-tissue]
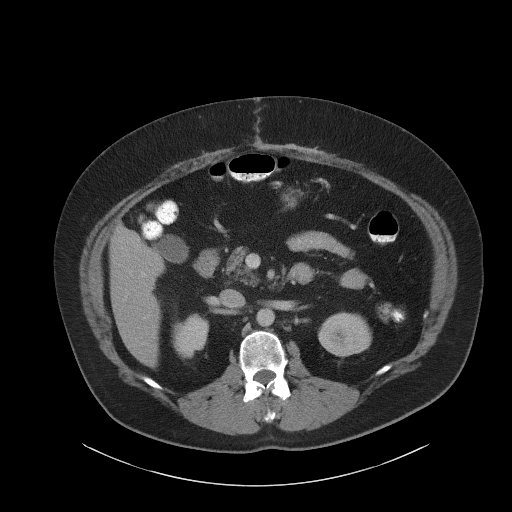
[im 75/102  soft-tissue]
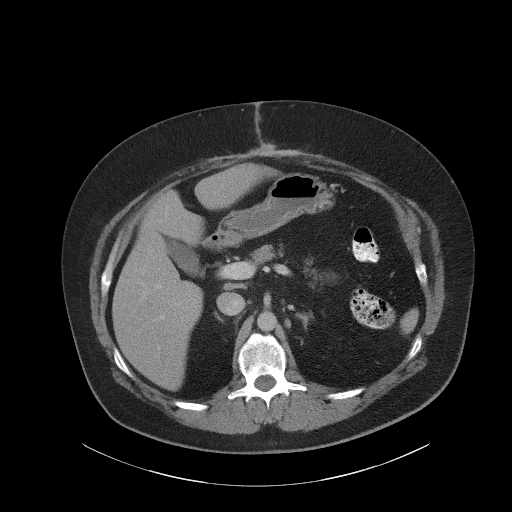
[im 80/102  soft-tissue]
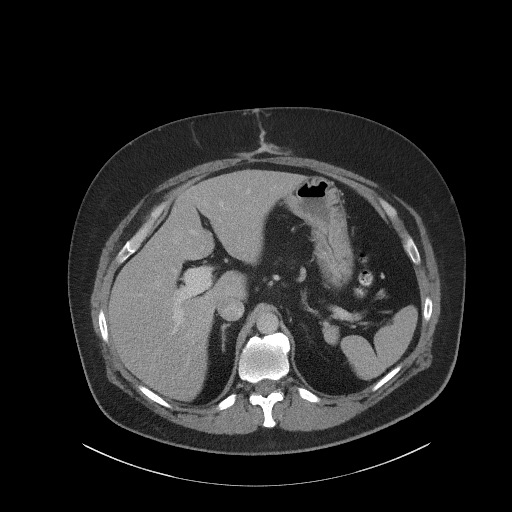
[im 91/102  soft-tissue]
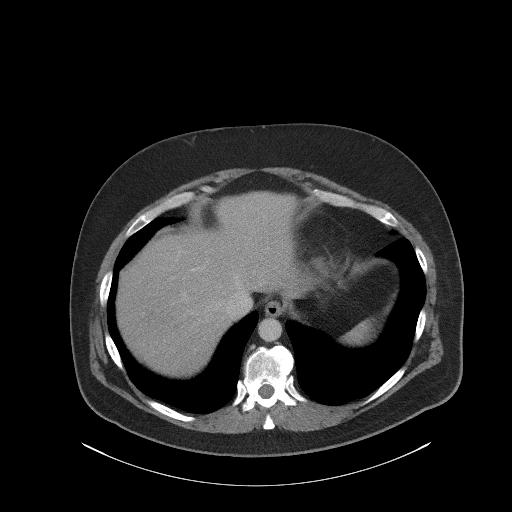
[im 96/102  soft-tissue]
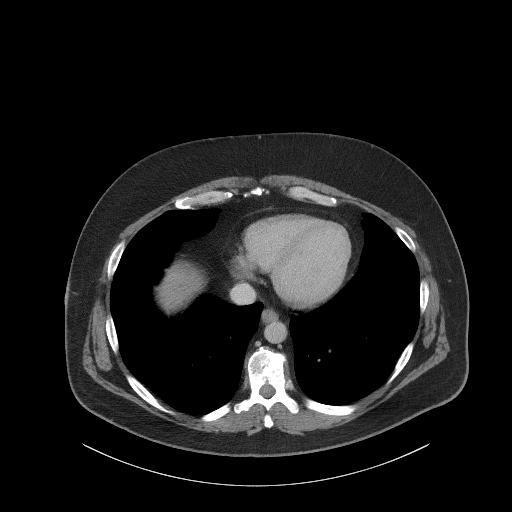

[Series 5: coronal st · coronal · 0.90mm/px · 3 of 120 slices shown]
[im 40/120  soft-tissue]
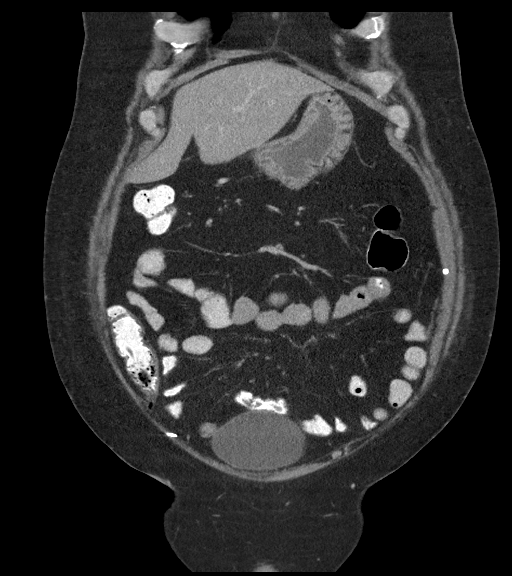
[im 53/120  soft-tissue]
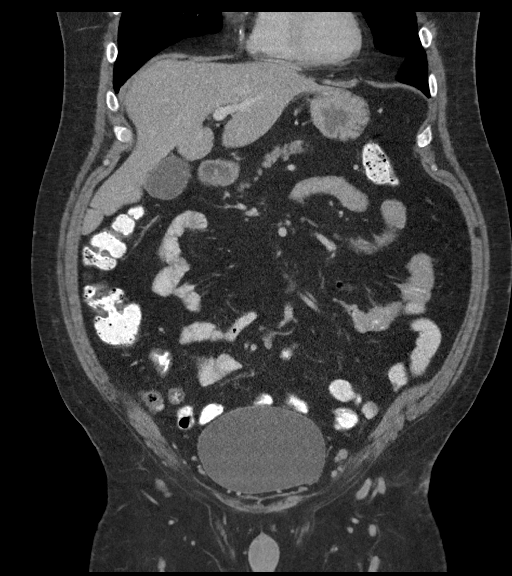
[im 67/120  soft-tissue]
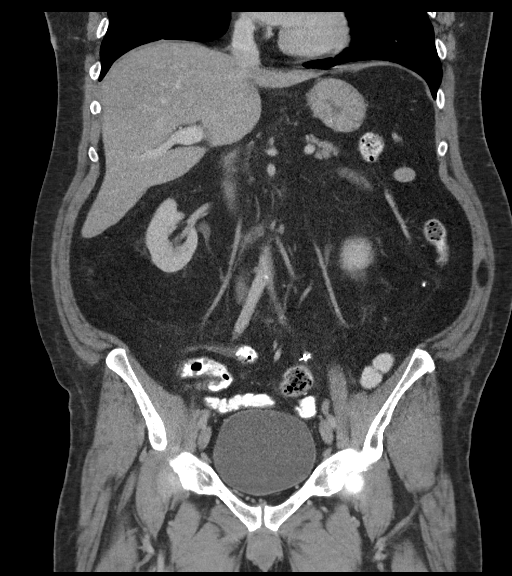

[17 of 46 positions shown; findings below may reference images not displayed]

FINDINGS: Lower Chest: No acute findings.

Hepatobiliary: No hepatic masses identified. Gallbladder is
unremarkable. No evidence of biliary ductal dilatation.

Pancreas:  No mass or inflammatory changes.

Spleen: Within normal limits in size and appearance.

Adrenals/Urinary Tract: No masses identified. No evidence of
hydronephrosis.

Stomach/Bowel: No evidence of obstruction, inflammatory process or
abnormal fluid collections.

Vascular/Lymphatic: No pathologically enlarged lymph nodes. No
abdominal aortic aneurysm. Aortic atherosclerosis.

Reproductive:  No mass or other significant abnormality.

Other: Stable postop changes in surgical mesh seen in the anterior
abdominal wall soft tissues. No evidence of recurrent hernia or
mass.

Musculoskeletal:  No suspicious bone lesions identified.
IMPRESSION: Stable exam.  No acute findings.

Aortic Atherosclerosis ([id number]).

## 2021-06-10 DIAGNOSIS — N400 Enlarged prostate without lower urinary tract symptoms: Secondary | ICD-10-CM | POA: Diagnosis not present

## 2021-06-10 DIAGNOSIS — I10 Essential (primary) hypertension: Secondary | ICD-10-CM | POA: Diagnosis not present

## 2021-06-10 DIAGNOSIS — E1140 Type 2 diabetes mellitus with diabetic neuropathy, unspecified: Secondary | ICD-10-CM | POA: Diagnosis not present

## 2021-06-10 DIAGNOSIS — R413 Other amnesia: Secondary | ICD-10-CM | POA: Diagnosis not present

## 2021-09-14 ENCOUNTER — Other Ambulatory Visit: Payer: Self-pay

## 2021-09-14 ENCOUNTER — Ambulatory Visit (INDEPENDENT_AMBULATORY_CARE_PROVIDER_SITE_OTHER): Payer: Managed Care, Other (non HMO) | Admitting: Cardiology

## 2021-09-14 ENCOUNTER — Encounter: Payer: Self-pay | Admitting: Cardiology

## 2021-09-14 VITALS — BP 124/80 | HR 68 | Ht 73.0 in | Wt 229.8 lb

## 2021-09-14 DIAGNOSIS — E782 Mixed hyperlipidemia: Secondary | ICD-10-CM | POA: Diagnosis not present

## 2021-09-14 DIAGNOSIS — I2510 Atherosclerotic heart disease of native coronary artery without angina pectoris: Secondary | ICD-10-CM

## 2021-09-14 DIAGNOSIS — F17210 Nicotine dependence, cigarettes, uncomplicated: Secondary | ICD-10-CM

## 2021-09-14 DIAGNOSIS — Z131 Encounter for screening for diabetes mellitus: Secondary | ICD-10-CM

## 2021-09-14 DIAGNOSIS — E559 Vitamin D deficiency, unspecified: Secondary | ICD-10-CM

## 2021-09-14 DIAGNOSIS — I10 Essential (primary) hypertension: Secondary | ICD-10-CM | POA: Diagnosis not present

## 2021-09-14 DIAGNOSIS — E0590 Thyrotoxicosis, unspecified without thyrotoxic crisis or storm: Secondary | ICD-10-CM

## 2021-09-14 NOTE — Progress Notes (Signed)
Cardiology Office Note:    Date:  09/14/2021   ID:  Calvin Wilkerson, DOB 11-Jun-1966, MRN [id number]  PCP:  Calvin Johns, MD  Cardiologist:  Calvin Lindau, MD   Referring MD: Calvin Johns, MD    ASSESSMENT:    1. Coronary artery disease involving native coronary artery of native heart without angina pectoris   2. HTN (hypertension), benign   3. Mixed hyperlipidemia   4. Cigarette smoker    PLAN:    In order of problems listed above:  Coronary artery disease: Secondary prevention stressed to the patient.  Importance of compliance with diet and medication stressed and he vocalized understanding he was advised to walk at least half an hour a day 5 days a week and he promises to do better. Essential hypertension: Blood pressure stable-discussed this with the patient Mixed dyslipidemia: Diet was emphasized.  He is fasting and will have complete blood work today.  He will also have hemoglobin A1c. Obesity: Weight reduction was stressed and diet was emphasized and he promises to do better. Cigarette smoker: He quit about 4 months ago.  He promises never to go back again.  He congratulated him and urged him never to go back to smoking and he understands. Vitamin D deficiency history and we will recheck this today. Patient will be seen in follow-up appointment in 9 months or earlier if the patient has any concerns    Medication Adjustments/Labs and Tests Ordered: Current medicines are reviewed at length with the patient today.  Concerns regarding medicines are outlined above.  No orders of the defined types were placed in this encounter.  No orders of the defined types were placed in this encounter.    No chief complaint on file.    History of Present Illness:    Calvin Wilkerson is a 56 y.o. male.  Patient has past medical history of coronary artery disease, essential hypertension, mixed dyslipidemia and peripheral vascular disease.  He denies any problems at this time and takes  care of activities of daily living.  No chest pain orthopnea or PND.  He ambulates but not to the point that he needs to exercise regularly.  I advised him about this.  At the time of my evaluation, the patient is alert awake oriented and in no distress.  Past Medical History:  Diagnosis Date   Abnormal stress test showing ischemia involving inferior wall 02/19/2019   Angina pectoris ([place]) 03/09/2019   Aortic atherosclerosis (HCC)    CAD (coronary artery disease) 07/06/2019   Cellulitis of right knee 07/12/2016   Cigarette smoker 01/15/2019   COPD (chronic obstructive pulmonary disease) (HCC)    Crohn's disease ([place])    DOE (dyspnea on exertion) 01/15/2019   Erectile dysfunction    GERD (gastroesophageal reflux disease)    GI bleed    History of diverticulitis    History of recurrent TIAs    HLD (hyperlipidemia) 12/24/2016   HTN (hypertension), benign 12/24/2016   Hypertension    Hyperthyroidism    Incisional hernia without obstruction or gangrene 12/31/2016   Mood disorder ([place])    PVD (peripheral vascular disease) ([place])    S/P hernia repair 12/16/2016   Added automatically from request for surgery [id number]  Formatting of this note might be different from the original. Added automatically from request for surgery [id number]   Status post laparoscopy 12/2016   removed mesh from previous hernia repair that was causing a strangulated small intestine.   TIA (transient ischemic attack) 09/05/2015  Vitamin D deficiency     Past Surgical History:  Procedure Laterality Date   APPENDECTOMY     COLECTOMY     COLONOSCOPY  01/2016   Colonic polyp status post polypectomy. Few erosions in the TI consistent with history of Crohns Disease. Mild pancolonic diverticulosis. Internal hemorrhoids.    COLOSTOMY REVERSAL     CORONARY STENT INTERVENTION N/A 03/16/2019   Procedure: CORONARY STENT INTERVENTION;  Surgeon: Tonny Bollman, MD;  Location: [facility] INVASIVE CV LAB;  Service: Cardiovascular;  Laterality: N/A;    [place]     HERNIA REPAIR     LEFT HEART CATH AND CORONARY ANGIOGRAPHY N/A 03/16/2019   Procedure: LEFT HEART CATH AND CORONARY ANGIOGRAPHY;  Surgeon: Tonny Bollman, MD;  Location: [facility] INVASIVE CV LAB;  Service: Cardiovascular;  Laterality: N/A;   UPPER GI ENDOSCOPY  2011   Healed esophageal erosions. Mild gastritis.     Current Medications: Current Meds  Medication Sig   acetaminophen (TYLENOL) 500 MG tablet Take 1,000 mg by mouth every 6 (six) hours as needed for pain (pain).   albuterol (VENTOLIN HFA) 108 (90 Base) MCG/ACT inhaler Inhale 2 puffs into the lungs daily.   atorvastatin (LIPITOR) 20 MG tablet Take 20 mg by mouth daily.   buPROPion (WELLBUTRIN XL) 150 MG 24 hr tablet Take 150 mg by mouth daily.   clopidogrel (PLAVIX) 75 MG tablet Take 75 mg by mouth daily.   dicyclomine (BENTYL) 10 MG capsule TAKE 1 CAPSULE BY MOUTH 4 TIMES DAILY BEFORE MEALS AND AT BEDTIME (APPOINTMENT NEEDED FOR FUTURE REFILLS)   docusate sodium (COLACE) 100 MG capsule Take 100 mg by mouth daily.   donepezil (ARICEPT) 10 MG tablet Take 10 mg by mouth daily.   EPINEPHrine 0.3 mg/0.3 mL IJ SOAJ injection Inject 0.3 mg into the muscle as needed for anaphylaxis.   fluticasone (FLONASE) 50 MCG/ACT nasal spray Place 1 spray into both nostrils daily.    furosemide (LASIX) 20 MG tablet Take 40 mg by mouth daily.    gabapentin (NEURONTIN) 300 MG capsule Take 300 mg by mouth 2 (two) times daily as needed for pain (pain).   losartan-hydrochlorothiazide (HYZAAR) 100-25 MG tablet Take 1 tablet by mouth daily.   metFORMIN (GLUCOPHAGE-XR) 500 MG 24 hr tablet Take 500 mg by mouth 2 (two) times daily.   methimazole (TAPAZOLE) 5 MG tablet Take 5 mg by mouth daily.   montelukast (SINGULAIR) 10 MG tablet Take 10 mg by mouth at bedtime.   Multiple Vitamins-Minerals (MULTIVITAMIN WITH MINERALS) tablet Take 1 tablet by mouth daily.   nitroGLYCERIN (NITROSTAT) 0.4 MG SL tablet Place 0.4 mg under the tongue every 5 (five)  minutes as needed for chest pain.   pantoprazole (PROTONIX) 40 MG tablet Take 1 tablet (40 mg total) by mouth daily. Please call [phone] to schedule an office visit for more refills.   tamsulosin (FLOMAX) 0.4 MG CAPS capsule Take 0.4 mg by mouth daily.   tiZANidine (ZANAFLEX) 4 MG tablet Take 4 mg by mouth at bedtime.   Vitamin D, Ergocalciferol, (DRISDOL) 50000 units CAPS capsule Take 50,000 Units by mouth every 7 (seven) days.     Allergies:   Patient has no known allergies.   Social History   Socioeconomic History   Marital status: Married    Spouse name: Not on file   Number of children: Not on file   Years of education: Not on file   Highest education level: Not on file  Occupational History   Not on file  Tobacco Use   Smoking status: Former    Packs/day: 1.50    Types: Cigarettes    Quit date: 09/03/2019    Years since quitting: 2.0   Smokeless tobacco: Never  Vaping Use   Vaping Use: Former  Substance and Sexual Activity   Alcohol use: Not Currently   Drug use: Never   Sexual activity: Not on file  Other Topics Concern   Not on file  Social History Narrative   Not on file   Social Determinants of Health   Financial Resource Strain: Not on file  Food Insecurity: Not on file  Transportation Needs: Not on file  Physical Activity: Not on file  Stress: Not on file  Social Connections: Not on file     Family History: The patient's family history includes Aneurysm in his brother and mother; Bone cancer in his brother and maternal grandmother; Breast cancer in his sister; Hypertension in his mother; Hypothyroidism in his mother.  ROS:   Please see the history of present illness.    All other systems reviewed and are negative.  EKGs/Labs/Other Studies Reviewed:    The following studies were reviewed today: I discussed my findings with the patient at length   Recent Labs: No results found for requested labs within last 8760 hours.  Recent Lipid Panel     Component Value Date/Time   CHOL 163 07/06/2019 0918   TRIG 293 (H) 07/06/2019 0918   HDL 35 (L) 07/06/2019 0918   CHOLHDL 4.7 07/06/2019 0918   LDLCALC 80 07/06/2019 0918    Physical Exam:    VS:  BP 124/80    Pulse 68    Ht 6\' 1"  (1.854 m)    Wt 229 lb 12.8 oz (104.2 kg)    SpO2 96%    BMI 30.32 kg/m     Wt Readings from Last 3 Encounters:  09/14/21 229 lb 12.8 oz (104.2 kg)  03/09/21 229 lb 6.4 oz (104.1 kg)  08/13/20 237 lb (107.5 kg)     GEN: Patient is in no acute distress HEENT: Normal NECK: No JVD; No carotid bruits LYMPHATICS: No lymphadenopathy CARDIAC: Hear sounds regular, 2/6 systolic murmur at the apex. RESPIRATORY:  Clear to auscultation without rales, wheezing or rhonchi  ABDOMEN: Soft, non-tender, non-distended MUSCULOSKELETAL:  No edema; No deformity  SKIN: Warm and dry NEUROLOGIC:  Alert and oriented x 3 PSYCHIATRIC:  Normal affect   Signed, Calvin Lindau, MD  09/14/2021 8:56 AM    [place]

## 2021-09-14 NOTE — Patient Instructions (Signed)
Medication Instructions:  °Your physician recommends that you continue on your current medications as directed. Please refer to the Current Medication list given to you today. ° °*If you need a refill on your cardiac medications before your next appointment, please call your pharmacy* ° ° °Lab Work: °Your physician recommends that you have labs done in the office today. Your test included  basic metabolic panel, complete blood count, TSH, hgb A1C, vitamin D, liver function and lipids. ° °If you have labs (blood work) drawn today and your tests are completely normal, you will receive your results only by: °MyChart Message (if you have MyChart) OR °A paper copy in the mail °If you have any lab test that is abnormal or we need to change your treatment, we will call you to review the results. ° ° °Testing/Procedures: °None ordered ° ° °Follow-Up: °At [facility], you and your health needs are our priority.  As part of our continuing mission to provide you with exceptional heart care, we have created designated Provider Care Teams.  These Care Teams include your primary Cardiologist (physician) and Advanced Practice Providers (APPs -  Physician Assistants and Nurse Practitioners) who all work together to provide you with the care you need, when you need it. ° °We recommend signing up for the patient portal called "MyChart".  Sign up information is provided on this After Visit Summary.  MyChart is used to connect with patients for Virtual Visits (Telemedicine).  Patients are able to view lab/test results, encounter notes, upcoming appointments, etc.  Non-urgent messages can be sent to your provider as well.   °To learn more about what you can do with MyChart, go to [email or web address].   ° °Your next appointment:   °9 month(s) ° °The format for your next appointment:   °In Person ° °Provider:   °Rajan Revankar, MD ° ° °Other Instructions °NA   °

## 2021-09-14 NOTE — Addendum Note (Signed)
Addended by: Eleonore Chiquito on: 09/14/2021 09:17 AM   Modules accepted: Orders

## 2021-09-15 LAB — BASIC METABOLIC PANEL
BUN/Creatinine Ratio: 12 (ref 9–20)
BUN: 11 mg/dL (ref 6–24)
CO2: 20 mmol/L (ref 20–29)
Calcium: 9.9 mg/dL (ref 8.7–10.2)
Chloride: 101 mmol/L (ref 96–106)
Creatinine, Ser: 0.95 mg/dL (ref 0.76–1.27)
Glucose: 103 mg/dL — ABNORMAL HIGH (ref 70–99)
Potassium: 4.3 mmol/L (ref 3.5–5.2)
Sodium: 140 mmol/L (ref 134–144)
eGFR: 93 mL/min/{1.73_m2} (ref >59)

## 2021-09-15 LAB — LIPID PANEL
Chol/HDL Ratio: 3.2 ratio (ref 0.0–5.0)
Cholesterol, Total: 131 mg/dL (ref 100–199)
HDL: 41 mg/dL (ref >39)
LDL Chol Calc (NIH): 71 mg/dL (ref 0–99)
Triglycerides: 99 mg/dL (ref 0–149)
VLDL Cholesterol Cal: 19 mg/dL (ref 5–40)

## 2021-09-15 LAB — CBC WITH DIFFERENTIAL/PLATELET
Basophils Absolute: 0.1 10*3/uL (ref 0.0–0.2)
Basos: 1 %
EOS (ABSOLUTE): 0.2 10*3/uL (ref 0.0–0.4)
Eos: 2 %
Hematocrit: 45.7 % (ref 37.5–51.0)
Hemoglobin: 16.2 g/dL (ref 13.0–17.7)
Immature Grans (Abs): 0 10*3/uL (ref 0.0–0.1)
Immature Granulocytes: 0 %
Lymphocytes Absolute: 2.1 10*3/uL (ref 0.7–3.1)
Lymphs: 19 %
MCH: 33.5 pg — ABNORMAL HIGH (ref 26.6–33.0)
MCHC: 35.4 g/dL (ref 31.5–35.7)
MCV: 95 fL (ref 79–97)
Monocytes Absolute: 0.9 10*3/uL (ref 0.1–0.9)
Monocytes: 9 %
Neutrophils Absolute: 7.7 10*3/uL — ABNORMAL HIGH (ref 1.4–7.0)
Neutrophils: 69 %
Platelets: 214 10*3/uL (ref 150–450)
RBC: 4.83 x10E6/uL (ref 4.14–5.80)
RDW: 13.1 % (ref 11.6–15.4)
WBC: 11 10*3/uL — ABNORMAL HIGH (ref 3.4–10.8)

## 2021-09-15 LAB — HEPATIC FUNCTION PANEL
ALT: 12 IU/L (ref 0–44)
AST: 16 IU/L (ref 0–40)
Albumin: 4.4 g/dL (ref 3.8–4.9)
Alkaline Phosphatase: 77 IU/L (ref 44–121)
Bilirubin Total: 0.3 mg/dL (ref 0.0–1.2)
Bilirubin, Direct: 0.12 mg/dL (ref 0.00–0.40)
Total Protein: 7.3 g/dL (ref 6.0–8.5)

## 2021-09-15 LAB — HEMOGLOBIN A1C
Est. average glucose Bld gHb Est-mCnc: 120 mg/dL
Hgb A1c MFr Bld: 5.8 % — ABNORMAL HIGH (ref 4.8–5.6)

## 2021-09-15 LAB — VITAMIN D 25 HYDROXY (VIT D DEFICIENCY, FRACTURES): Vit D, 25-Hydroxy: 70.9 ng/mL (ref 30.0–100.0)

## 2021-09-15 LAB — TSH: TSH: 0.976 u[IU]/mL (ref 0.450–4.500)

## 2021-09-16 ENCOUNTER — Telehealth: Payer: Self-pay

## 2021-09-16 NOTE — Telephone Encounter (Signed)
Spoke with patient regarding results and recommendation.  Patient verbalizes understanding and is agreeable to plan of care. Advised patient to call back with any issues or concerns.  

## 2021-09-16 NOTE — Telephone Encounter (Signed)
-----   Message from Garwin Brothers, MD sent at 09/15/2021  8:52 AM EST ----- The results of the study is unremarkable. Please inform patient. I will discuss in detail at next appointment. Cc  primary care/referring physician Garwin Brothers, MD 09/15/2021 8:51 AM

## 2021-09-28 ENCOUNTER — Other Ambulatory Visit: Payer: Self-pay

## 2021-09-28 MED ORDER — CLOPIDOGREL BISULFATE 75 MG PO TABS: 75.0000 mg | ORAL_TABLET | Freq: Every day | ORAL | 3 refills | Status: DC

## 2021-09-28 MED ORDER — LOSARTAN POTASSIUM-HCTZ 100-25 MG PO TABS: 1.0000 | ORAL_TABLET | Freq: Every day | ORAL | 3 refills | Status: DC

## 2022-08-04 ENCOUNTER — Telehealth: Payer: Self-pay | Admitting: Cardiology

## 2022-08-04 MED ORDER — CLOPIDOGREL BISULFATE 75 MG PO TABS: 75.0000 mg | ORAL_TABLET | Freq: Every day | ORAL | 0 refills | Status: DC

## 2022-08-04 NOTE — Telephone Encounter (Signed)
*  STAT* If patient is at the pharmacy, call can be transferred to refill team.   1. Which medications need to be refilled? (please list name of each medication and dose if known)   clopidogrel (PLAVIX) 75 MG tablet    2. Which pharmacy/location (including street and city if local pharmacy) is medication to be sent to? [facility]   3. Do they need a 30 day or 90 day supply? 90 day

## 2022-08-05 DIAGNOSIS — E1140 Type 2 diabetes mellitus with diabetic neuropathy, unspecified: Secondary | ICD-10-CM | POA: Diagnosis not present

## 2022-08-05 DIAGNOSIS — Z79899 Other long term (current) drug therapy: Secondary | ICD-10-CM | POA: Diagnosis not present

## 2022-08-05 DIAGNOSIS — F419 Anxiety disorder, unspecified: Secondary | ICD-10-CM | POA: Diagnosis not present

## 2022-08-05 DIAGNOSIS — Z23 Encounter for immunization: Secondary | ICD-10-CM | POA: Diagnosis not present

## 2022-08-05 DIAGNOSIS — I10 Essential (primary) hypertension: Secondary | ICD-10-CM | POA: Diagnosis not present

## 2022-08-05 DIAGNOSIS — I739 Peripheral vascular disease, unspecified: Secondary | ICD-10-CM | POA: Diagnosis not present

## 2022-08-05 DIAGNOSIS — G479 Sleep disorder, unspecified: Secondary | ICD-10-CM | POA: Diagnosis not present

## 2022-08-05 DIAGNOSIS — I700 Atherosclerosis of aorta: Secondary | ICD-10-CM | POA: Diagnosis not present

## 2022-08-05 DIAGNOSIS — E559 Vitamin D deficiency, unspecified: Secondary | ICD-10-CM | POA: Diagnosis not present

## 2022-08-05 DIAGNOSIS — E785 Hyperlipidemia, unspecified: Secondary | ICD-10-CM | POA: Diagnosis not present

## 2022-08-05 LAB — LAB REPORT - SCANNED
A1c: 5.9
Albumin, Urine POC: 3
Creatinine, Urine.: 9.3

## 2022-08-06 ENCOUNTER — Encounter: Payer: Self-pay | Admitting: Gastroenterology

## 2022-09-03 ENCOUNTER — Ambulatory Visit: Payer: Medicare HMO | Admitting: Cardiology

## 2022-09-14 ENCOUNTER — Telehealth: Payer: Self-pay

## 2022-09-14 ENCOUNTER — Ambulatory Visit: Payer: Medicare HMO | Admitting: Gastroenterology

## 2022-09-14 ENCOUNTER — Encounter: Payer: Self-pay | Admitting: Gastroenterology

## 2022-09-14 ENCOUNTER — Other Ambulatory Visit: Payer: Medicare HMO

## 2022-09-14 VITALS — BP 110/60 | HR 68 | Ht 73.0 in | Wt 232.2 lb

## 2022-09-14 DIAGNOSIS — K219 Gastro-esophageal reflux disease without esophagitis: Secondary | ICD-10-CM | POA: Diagnosis not present

## 2022-09-14 DIAGNOSIS — R197 Diarrhea, unspecified: Secondary | ICD-10-CM | POA: Diagnosis not present

## 2022-09-14 DIAGNOSIS — R1013 Epigastric pain: Secondary | ICD-10-CM

## 2022-09-14 MED ORDER — DICYCLOMINE HCL 10 MG PO CAPS: 10.0000 mg | ORAL_CAPSULE | Freq: Three times a day (TID) | ORAL | 1 refills | Status: DC

## 2022-09-14 MED ORDER — PANTOPRAZOLE SODIUM 40 MG PO TBEC: 40.0000 mg | DELAYED_RELEASE_TABLET | Freq: Every day | ORAL | 4 refills | Status: DC

## 2022-09-14 NOTE — Telephone Encounter (Signed)
 [facility] Pre-operative Risk Assessment     Request for surgical clearance:     Endoscopy Procedure  What type of surgery is being performed?     EGD/Colon  When is this surgery scheduled?     11-08-2022  What type of clearance is required ?   Pharmacy  Are there any medications that need to be held prior to surgery and how long? Plavix 5 day hold prior  Practice name and name of physician performing surgery?      [facility]  What is your office phone and fax number?      Phone- [phone]  [phone]  Anesthesia type (None, local, MAC, general) ?       MAC

## 2022-09-14 NOTE — Patient Instructions (Addendum)
_______________________________________________________  If your blood pressure at your visit was 140/90 or greater, please contact your primary care physician to follow up on this.  _______________________________________________________  If you are age 59 or older, your body mass index should be between 23-30. Your Body mass index is 30.64 kg/m. If this is out of the aforementioned range listed, please consider follow up with your Primary Care Provider.  If you are age 7 or younger, your body mass index should be between 19-25. Your Body mass index is 30.64 kg/m. If this is out of the aformentioned range listed, please consider follow up with your Primary Care Provider.   ________________________________________________________  The [facility] providers would like to encourage you to use [facility] to communicate with providers for non-urgent requests or questions.  Due to long hold times on the telephone, sending your provider a message by [facility] may be a faster and more efficient way to get a response.  Please allow 48 business hours for a response.  Please remember that this is for non-urgent requests.  _______________________________________________________  Your provider has requested that you go to the basement level for lab work before leaving today. Press "B" on the elevator. The lab is located at the first door on the left as you exit the elevator.  We have sent the following medications to your pharmacy for you to pick up at your convenience: Bentyl  Protonix  You have been scheduled for an endoscopy and colonoscopy. Please follow the written instructions given to you at your visit today. Please pick up your prep supplies at the pharmacy within the next 1-3 days. If you use inhalers (even only as needed), please bring them with you on the day of your procedure.  You will be contacted by our office prior to your procedure for directions on holding your Plavix.  If you do not hear  from our office 2 week prior to your scheduled procedure, please call [phone] to discuss.   Thank you,  Dr. Jackquline Denmark

## 2022-09-14 NOTE — Progress Notes (Signed)
Chief Complaint:  FU Abdominal pain  Referring Provider:  Dr Rica Records      ASSESSMENT AND PLAN;   #1. Diarrhea- ?Etiology-could represent IBS-D. R/O infectious or inflammatory etiology especially with history of IBD suspect.  #2. GERD with epi pain  #3. Chronic abdominal pain - likely abdominal wall pain, neg CT AP 05/2019, 11/2017 for any SBO.  D/t multiple abdominal surgeries he is expected to have adhesions.  Using abdominal binder.  #4.  IBD suspect. Likely Mild Crohn's disease involving TI. Dx on Colon 01/2016-few erosions in the terminal ileum, mild activity. Bx- neg.  No meds. Asymptomatic.  #5. Large ventral hernia s/p repair complicated by PBSO requiring laparoscopy Jan 2018 at [facility]. Neg CT 05/2019 for any recurrent hernia.  Plan: -Stool studies for GI Pathogen (includes C. Diff), fecal elastase, and Calprotectin) -Continue protonix 5m po qd #90, 4RF -Continue Bentyl 176mpo qid #120 4RF -EGD/colon after cardio clearence, need to hold plavix 5 days, with miralax.   I discussed EGD/Colonoscopy- the indications, risks, alternatives and potential complications including, but not limited to bleeding, infection, reaction to meds, damage to internal organs, cardiac and/or pulmonary problems, and perforation requiring surgery. The possibility that significant findings could be missed was explained. All ? were answered. Pt consents to proceed. HPI:   [age]ear old  With CAD s/p stenting 03/16/2019 on plavix (Nl EF), HTN, HLD, borderlike DM, cig smoking  For follow-up visit.  Doing much better overall except for intermittent epigastric/chronic abdominal pain.  Denies having any nausea or vomiting.  No melena or hematochezia.  No odynophagia or dysphagia.  He he has been compliant with Protonix which has improved his symptoms considerably.  Also with intermittent diarrhea more prominently over the last 2 months, denies any nocturnal symptoms.  No melena or hematochezia.  He does use  well water.  Bentyl did help.  No recent antibiotics.  He is due for repeat EGD/colonoscopy.  Previously did not get cardiology clearance in 2020 due to recent stent insertion.  He could not be taken off Plavix.  Most recent stress test was negative.  He continues to smoke despite medical advice.  Being closely followed by Dr. ReGeraldo Pitter Seen by Dr. UpRica Recordsper patient had normal labs.  I do see previously scanned reports including normal CBC, CMP.         Past GI procedures:  CT AP with contrast 05/11/2019 IMPRESSION: Stable exam.  No acute findings. Stable postop changes in surgical mesh seen in the anterior abdominal wall soft tissues. No evidence of recurrent hernia or mass. Aortic Atherosclerosis (ICD10-I70.0).  - CT A/P 12/01/2017 with p.o. and IV contrast-no acute abnormality, no small bowel obstruction, no abscesses, aortic atherosclerosis. -Colonoscopy 02/09/2016 ([facility])-colonic polyp status post polypectomy, few erosions in TI consistent with history of Crohn's disease.  Mild activity.  Mild pancolonic diverticulosis, sigmoid resection, internal hemorrhoids. -EGD 11/05/2009-healed distal esophageal erosions.  Mild gastritis.  CT AP 05/2019 Stable exam.  No acute findings.   Aortic Atherosclerosis (ICD10-I70.0).  Past Medical History:  Diagnosis Date       GI bleed    Hypertension    Hyperthyroidism    Status post laparoscopy 12/2016   removed mesh from previous hernia repair that was causing a strangulated small intestine.    Past Surgical History:  Procedure Laterality Date   APPENDECTOMY     COLECTOMY     COLONOSCOPY  01/2016   Colonic polyp status post polypectomy. Few erosions in the TI consistent with history  of Crohns Disease. Mild pancolonic diverticulosis. Internal hemorrhoids.    COLOSTOMY REVERSAL     CORONARY STENT INTERVENTION N/A 03/16/2019   Procedure: CORONARY STENT INTERVENTION;  Surgeon: Sherren Mocha, MD;  Location: [facility];  Service:  Cardiovascular;  Laterality: N/A;   [place]     HERNIA REPAIR     LEFT HEART CATH AND CORONARY ANGIOGRAPHY N/A 03/16/2019   Procedure: LEFT HEART CATH AND CORONARY ANGIOGRAPHY;  Surgeon: Sherren Mocha, MD;  Location: [facility];  Service: Cardiovascular;  Laterality: N/A;   UPPER GI ENDOSCOPY  2011   Healed esophageal erosions. Mild gastritis.     Family History  Problem Relation Age of Onset   Hypothyroidism Mother    Hypertension Mother    Aneurysm Mother    Breast cancer Sister    Bone cancer Brother    Aneurysm Brother    Bone cancer Maternal Grandmother     Social History   Tobacco Use   Smoking status: Former    Packs/day: 1.50    Types: Cigarettes    Quit date: 09/03/2019    Years since quitting: 3.0   Smokeless tobacco: Never  Vaping Use   Vaping Use: Former  Substance Use Topics   Alcohol use: Not Currently   Drug use: Never    Current Outpatient Medications  Medication Sig Dispense Refill   acetaminophen (TYLENOL) 500 MG tablet Take 1,000 mg by mouth every 6 (six) hours as needed for pain (pain).     albuterol (VENTOLIN HFA) 108 (90 Base) MCG/ACT inhaler Inhale 2 puffs into the lungs daily.     atorvastatin (LIPITOR) 20 MG tablet Take 20 mg by mouth daily.     buPROPion (WELLBUTRIN XL) 150 MG 24 hr tablet Take 150 mg by mouth daily.     clopidogrel (PLAVIX) 75 MG tablet Take 1 tablet (75 mg total) by mouth daily. 90 tablet 0   docusate sodium (COLACE) 100 MG capsule Take 100 mg by mouth daily.     donepezil (ARICEPT) 10 MG tablet Take 10 mg by mouth daily.     fluticasone (FLONASE) 50 MCG/ACT nasal spray Place 1 spray into both nostrils daily.      furosemide (LASIX) 20 MG tablet Take 40 mg by mouth daily.      gabapentin (NEURONTIN) 300 MG capsule Take 300 mg by mouth 2 (two) times daily as needed for pain (pain).     losartan-hydrochlorothiazide (HYZAAR) 100-25 MG tablet Take 1 tablet by mouth daily. 90 tablet 3   metFORMIN (GLUCOPHAGE-XR) 500 MG  24 hr tablet Take 500 mg by mouth 2 (two) times daily.     methimazole (TAPAZOLE) 5 MG tablet Take 5 mg by mouth daily.     montelukast (SINGULAIR) 10 MG tablet Take 10 mg by mouth at bedtime.     Multiple Vitamins-Minerals (MULTIVITAMIN WITH MINERALS) tablet Take 1 tablet by mouth daily.     pantoprazole (PROTONIX) 40 MG tablet Take 1 tablet (40 mg total) by mouth daily. Please call [phone] to schedule an office visit for more refills. 90 tablet 0   pregabalin (LYRICA) 75 MG capsule Take 75 mg by mouth 2 (two) times daily.     tamsulosin (FLOMAX) 0.4 MG CAPS capsule Take 0.4 mg by mouth daily.     tiZANidine (ZANAFLEX) 4 MG tablet Take 4 mg by mouth at bedtime.     Vitamin D, Ergocalciferol, (DRISDOL) 50000 units CAPS capsule Take 50,000 Units by mouth every 7 (seven) days.  dicyclomine (BENTYL) 10 MG capsule TAKE 1 CAPSULE BY MOUTH 4 TIMES DAILY BEFORE MEALS AND AT BEDTIME (APPOINTMENT NEEDED FOR FUTURE REFILLS) (Patient not taking: Reported on 09/14/2022) 360 capsule 0   EPINEPHrine 0.3 mg/0.3 mL IJ SOAJ injection Inject 0.3 mg into the muscle as needed for anaphylaxis. (Patient not taking: Reported on 09/14/2022)     No current facility-administered medications for this visit.    No Known Allergies  Review of Systems:  neg     Physical Exam:    BP 110/60 (BP Location: Left Arm, Patient Position: Sitting, Cuff Size: Normal)   Pulse 68   Ht 6' 1"$  (1.854 m)   Wt 232 lb 4 oz (105.3 kg)   BMI 30.64 kg/m  Filed Weights   09/14/22 0959  Weight: 232 lb 4 oz (105.3 kg)   Constitutional:  Well-developed, in no acute distress. Psychiatric: Normal mood and affect. Behavior is normal. HEENT: Pupils normal.  Conjunctivae are normal. No scleral icterus. Cardiovascular: Normal rate, regular rhythm. No edema Pulmonary/chest: Effort normal and breath sounds normal. No wheezing, rales or rhonchi. Abdominal: Soft, nondistended.  Generalized abdominal tenderness. bowel sounds active  throughout. There are no masses palpable. No hepatomegaly.  Multiple well-healed scars  Rectal:  defered Neurological: Alert and oriented to person place and time. Skin: Skin is warm and dry. No rashes noted.    Carmell Austria, MD   Cc: Dr Rica Records

## 2022-09-14 NOTE — Telephone Encounter (Signed)
Patient call back, I advised him to stop taking Plavix on 11/03/22. Patient verbalized understanding.

## 2022-09-14 NOTE — Telephone Encounter (Signed)
   Patient Name: Calvin Wilkerson  DOB: 24-Sep-1965 MRN: [id number]  Primary Cardiologist: Jenean Lindau, MD  Chart reviewed as part of pre-operative protocol coverage.   Per DAPT algorithm and office protocol patient can hold Plavix 5 days prior to his procedure and restart when hemostasis can be achieved postprocedure.  I will route this recommendation to the requesting party via Epic fax function and remove from pre-op pool.  Please call with questions.  Mable Fill, Marissa Nestle, NP 09/14/2022, 10:57 AM

## 2022-09-14 NOTE — Telephone Encounter (Signed)
Perfect thank you!

## 2022-09-14 NOTE — Telephone Encounter (Signed)
LVM for patient to call back so he can be told to stop his Plavix 5 days prior to colonoscopy on 11-08-2022

## 2022-09-23 ENCOUNTER — Telehealth: Payer: Self-pay | Admitting: Cardiology

## 2022-09-23 MED ORDER — LOSARTAN POTASSIUM-HCTZ 100-25 MG PO TABS: 1.0000 | ORAL_TABLET | Freq: Every day | ORAL | 0 refills | Status: DC

## 2022-09-23 NOTE — Telephone Encounter (Signed)
*  STAT* If patient is at the pharmacy, call can be transferred to refill team.   1. Which medications need to be refilled? (please list name of each medication and dose if known) losartan-hydrochlorothiazide (HYZAAR) 100-25 MG tablet   2. Which pharmacy/location (including street and city if local pharmacy) is medication to be sent to? [place], [place]   3. Do they need a 30 day or 90 day supply? [place]

## 2022-09-29 DIAGNOSIS — R109 Unspecified abdominal pain: Secondary | ICD-10-CM | POA: Diagnosis not present

## 2022-09-29 DIAGNOSIS — L739 Follicular disorder, unspecified: Secondary | ICD-10-CM | POA: Diagnosis not present

## 2022-09-29 DIAGNOSIS — R37 Sexual dysfunction, unspecified: Secondary | ICD-10-CM | POA: Diagnosis not present

## 2022-09-29 DIAGNOSIS — M545 Low back pain, unspecified: Secondary | ICD-10-CM | POA: Diagnosis not present

## 2022-10-19 ENCOUNTER — Other Ambulatory Visit: Payer: Medicare HMO

## 2022-10-19 DIAGNOSIS — K219 Gastro-esophageal reflux disease without esophagitis: Secondary | ICD-10-CM

## 2022-10-19 DIAGNOSIS — R197 Diarrhea, unspecified: Secondary | ICD-10-CM | POA: Diagnosis not present

## 2022-10-19 DIAGNOSIS — R1013 Epigastric pain: Secondary | ICD-10-CM | POA: Diagnosis not present

## 2022-10-19 DIAGNOSIS — A09 Infectious gastroenteritis and colitis, unspecified: Secondary | ICD-10-CM | POA: Diagnosis not present

## 2022-10-20 LAB — CLOSTRIDIUM DIFFICILE TOXIN B, QUALITATIVE, REAL-TIME PCR: Toxigenic C. Difficile by PCR: NOT DETECTED

## 2022-10-21 LAB — GI PROFILE, STOOL, PCR

## 2022-10-22 ENCOUNTER — Other Ambulatory Visit: Payer: Self-pay | Admitting: Cardiology

## 2022-10-25 LAB — CALPROTECTIN, FECAL: Calprotectin, Fecal: 76 ug/g (ref 0–120)

## 2022-10-25 LAB — PANCREATIC ELASTASE, FECAL: Pancreatic Elastase-1, Stool: >500 mcg/g

## 2022-11-03 DIAGNOSIS — I10 Essential (primary) hypertension: Secondary | ICD-10-CM | POA: Diagnosis not present

## 2022-11-03 DIAGNOSIS — M545 Low back pain, unspecified: Secondary | ICD-10-CM | POA: Diagnosis not present

## 2022-11-03 DIAGNOSIS — E1140 Type 2 diabetes mellitus with diabetic neuropathy, unspecified: Secondary | ICD-10-CM | POA: Diagnosis not present

## 2022-11-03 DIAGNOSIS — Z6830 Body mass index (BMI) 30.0-30.9, adult: Secondary | ICD-10-CM | POA: Diagnosis not present

## 2022-11-03 DIAGNOSIS — E785 Hyperlipidemia, unspecified: Secondary | ICD-10-CM | POA: Diagnosis not present

## 2022-11-08 ENCOUNTER — Ambulatory Visit (AMBULATORY_SURGERY_CENTER): Payer: Medicare HMO | Admitting: Gastroenterology

## 2022-11-08 ENCOUNTER — Encounter: Payer: Self-pay | Admitting: Gastroenterology

## 2022-11-08 VITALS — BP 122/83 | HR 55 | Temp 98.0°F | Resp 16 | Ht 73.0 in | Wt 232.0 lb

## 2022-11-08 DIAGNOSIS — K2970 Gastritis, unspecified, without bleeding: Secondary | ICD-10-CM | POA: Diagnosis not present

## 2022-11-08 DIAGNOSIS — I10 Essential (primary) hypertension: Secondary | ICD-10-CM | POA: Diagnosis not present

## 2022-11-08 DIAGNOSIS — K219 Gastro-esophageal reflux disease without esophagitis: Secondary | ICD-10-CM

## 2022-11-08 DIAGNOSIS — K3189 Other diseases of stomach and duodenum: Secondary | ICD-10-CM | POA: Diagnosis not present

## 2022-11-08 DIAGNOSIS — R197 Diarrhea, unspecified: Secondary | ICD-10-CM | POA: Diagnosis not present

## 2022-11-08 MED ORDER — SODIUM CHLORIDE 0.9 % IV SOLN: 500.0000 mL | Freq: Once | INTRAVENOUS | Status: AC

## 2022-11-08 NOTE — Op Note (Signed)
[facility] Patient Name: Calvin Wilkerson Procedure Date: 11/08/2022 11:14 AM MRN: [id number] Endoscopist: Lynann Bologna , MD, [id number] Age: 57 Referring MD:  Date of Birth: [birth year] Gender: Male Account #: [id number] Procedure:                Colonoscopy Indications:              Chronic diarrhea. IBD suspect Medicines:                Monitored Anesthesia Care Procedure:                Pre-Anesthesia Assessment:                           - Prior to the procedure, a History and Physical                            was performed, and patient medications and                            allergies were reviewed. The patient's tolerance of                            previous anesthesia was also reviewed. The risks                            and benefits of the procedure and the sedation                            options and risks were discussed with the patient.                            All questions were answered, and informed consent                            was obtained. Prior Anticoagulants: Plavix was held                            5 days prior. ASA Grade Assessment: II - A patient                            with mild systemic disease. After reviewing the                            risks and benefits, the patient was deemed in                            satisfactory condition to undergo the procedure.                           After obtaining informed consent, the colonoscope                            was passed under direct vision. Throughout the  procedure, the patient's blood pressure, pulse, and                            oxygen saturations were monitored continuously. The                            CF [id number] #[id number] was introduced through the anus                            and advanced to the 4 cm into the ileum. The                            colonoscopy was performed without difficulty. The                            patient tolerated  the procedure well. The quality                            of the bowel preparation was good. The terminal                            ileum, ileocecal valve, appendiceal orifice, and                            rectum were photographed. Scope In: 11:30:35 AM Scope Out: 11:43:38 AM Scope Withdrawal Time: 0 hours 10 minutes 0 seconds  Total Procedure Duration: 0 hours 13 minutes 3 seconds  Findings:                 The colon (entire examined portion) appeared                            normal. Biopsies for histology were taken with a                            cold forceps from the entire colon for evaluation                            of microscopic colitis.                           There was evidence of a prior end-to-end                            colo-colonic anastomosis in the sigmoid colon, 25                            cm from the anal verge. This was patent.                           A few medium-mouthed diverticula were found in the                            neo-sigmoid colon, transverse colon and ascending  colon.                           The terminal ileum appeared normal. No erosions or                            ulcers. Biopsies were taken with a cold forceps for                            histology.                           Non-bleeding internal hemorrhoids were found during                            retroflexion. The hemorrhoids were small and Grade                            I (internal hemorrhoids that do not prolapse).                           The exam was otherwise without abnormality on                            direct and retroflexion views. Complications:            No immediate complications. Estimated Blood Loss:     Estimated blood loss: none. Impression:               - The entire examined colon is normal. Biopsied.                           - Patent end-to-end colo-colonic anastomosis.                           - Mild pancolonic  diverticulosis.                           - The examined portion of the ileum was normal.                            Biopsied.                           - Non-bleeding internal hemorrhoids.                           - The examination was otherwise normal on direct                            and retroflexion views. Recommendation:           - Patient has a contact number available for                            emergencies. The signs and symptoms of potential  delayed complications were discussed with the                            patient. Return to normal activities tomorrow.                            Written discharge instructions were provided to the                            patient.                           - Resume previous diet.                           - Continue present medications.                           - Await pathology results.                           - Resume Plavix (clopidogrel) at prior dose in 3                            days.                           - FU GI in 8-12 weeks.                           - The findings and recommendations were discussed                            with the patient's family. Lynann Bologna, MD 11/08/2022 11:49:25 AM This report has been signed electronically.

## 2022-11-08 NOTE — Patient Instructions (Signed)
Resume Plavix in 3 days  FOLLOW UP APPOINTMENT MADE FOR YOU TO SEE DR GUPTA BACK IN HIS OFFICE - SEE NEXT PAGE FOR APPOINT DATE AND TIME   Handouts on gastritis given to  you today  Handouts on diverticulosis & hemorrhoids given to you today    YOU HAD AN ENDOSCOPIC PROCEDURE TODAY AT THE [facility]:   Refer to the procedure report that was given to you for any specific questions about what was found during the examination.  If the procedure report does not answer your questions, please call your gastroenterologist to clarify.  If you requested that your care partner not be given the details of your procedure findings, then the procedure report has been included in a sealed envelope for you to review at your convenience later.  YOU SHOULD EXPECT: Some feelings of bloating in the abdomen. Passage of more gas than usual.  Walking can help get rid of the air that was put into your GI tract during the procedure and reduce the bloating. If you had a lower endoscopy (such as a colonoscopy or flexible sigmoidoscopy) you may notice spotting of blood in your stool or on the toilet paper. If you underwent a bowel prep for your procedure, you may not have a normal bowel movement for a few days.  Please Note:  You might notice some irritation and congestion in your nose or some drainage.  This is from the oxygen used during your procedure.  There is no need for concern and it should clear up in a day or so.  SYMPTOMS TO REPORT IMMEDIATELY:  Following lower endoscopy (colonoscopy or flexible sigmoidoscopy):  Excessive amounts of blood in the stool  Significant tenderness or worsening of abdominal pains  Swelling of the abdomen that is new, acute  Fever of 100F or higher  Following upper endoscopy (EGD)  Vomiting of blood or coffee ground material  New chest pain or pain under the shoulder blades  Painful or persistently difficult swallowing  New shortness of breath  Fever of 100F  or higher  Black, tarry-looking stools  For urgent or emergent issues, a gastroenterologist can be reached at any hour by calling [phone]. Do not use MyChart messaging for urgent concerns.    DIET:  We do recommend a small meal at first, but then you may proceed to your regular diet.  Drink plenty of fluids but you should avoid alcoholic beverages for 24 hours.  ACTIVITY:  You should plan to take it easy for the rest of today and you should NOT DRIVE or use heavy machinery until tomorrow (because of the sedation medicines used during the test).    FOLLOW UP: Our staff will call the number listed on your records the next business day following your procedure.  We will call around 7:15- 8:00 am to check on you and address any questions or concerns that you may have regarding the information given to you following your procedure. If we do not reach you, we will leave a message.     If any biopsies were taken you will be contacted by phone or by letter within the next 1-3 weeks.  Please call us at [phone] if you have not heard about the biopsies in 3 weeks.    SIGNATURES/CONFIDENTIALITY: You and/or your care partner have signed paperwork which will be entered into your electronic medical record.  These signatures attest to the fact that that the information above on your After Visit Summary  has been reviewed and is understood.  Full responsibility of the confidentiality of this discharge information lies with you and/or your care-partner. 

## 2022-11-08 NOTE — Op Note (Signed)
[facility] Patient Name: Calvin Wilkerson Procedure Date: 11/08/2022 11:14 AM MRN: [id number] Endoscopist: Lynann Bologna , MD, [id number] Age: 57 Referring MD:  Date of Birth: 03-08-1966 Gender: Male Account #: [id number] Procedure:                Upper GI endoscopy Indications:              Epigastric abdominal pain. GERD. Medicines:                Monitored Anesthesia Care Procedure:                Pre-Anesthesia Assessment:                           - Prior to the procedure, a History and Physical                            was performed, and patient medications and                            allergies were reviewed. The patient's tolerance of                            previous anesthesia was also reviewed. The risks                            and benefits of the procedure and the sedation                            options and risks were discussed with the patient.                            All questions were answered, and informed consent                            was obtained. Prior Anticoagulants: Plavix was on                            hold for 5 days. ASA Grade Assessment: II - A                            patient with mild systemic disease. After reviewing                            the risks and benefits, the patient was deemed in                            satisfactory condition to undergo the procedure.                           After obtaining informed consent, the endoscope was                            passed under direct vision. Throughout the  procedure, the patient's blood pressure, pulse, and                            oxygen saturations were monitored continuously. The                            GIF [id number] #[id number] was introduced through the                            mouth, and advanced to the second part of duodenum.                            The upper GI endoscopy was accomplished without                             difficulty. The patient tolerated the procedure                            well. Scope In: Scope Out: Findings:                 The Z-line was irregular and was found 40 cm from                            the incisors. Biopsies were taken with a cold                            forceps for histology.                           Diffuse mild inflammation characterized by                            nodularity was found in the entire examined                            stomach. Biopsies were taken with a cold forceps                            for histology. Some retained food in the stomach                            did limit the exam. No outlet obstruction.                           The examined duodenum was normal. Biopsies for                            histology were taken with a cold forceps for                            evaluation of celiac disease. Complications:            No immediate complications. Estimated Blood Loss:     Estimated blood loss: none. Impression:               -  Nodular gastritis. Biopsied.                           - Irregular Z-line Recommendation:           - Patient has a contact number available for                            emergencies. The signs and symptoms of potential                            delayed complications were discussed with the                            patient. Return to normal activities tomorrow.                            Written discharge instructions were provided to the                            patient.                           - Resume previous diet.                           - Continue present medications.                           - Await pathology results.                           - Proceed with colonoscopy.                           - The findings and recommendations were discussed                            with the patient's family. Lynann Bologna, MD 11/08/2022 11:45:14 AM This report has been signed electronically.

## 2022-11-08 NOTE — Progress Notes (Signed)
Uneventful anesthetic. Report to pacu rn. Vss. Care resumed by rn. 

## 2022-11-08 NOTE — Progress Notes (Signed)
Chief Complaint:  FU Abdominal pain  Referring Provider:  Dr Mathis Bud      ASSESSMENT AND PLAN;   #1. Diarrhea- ?Etiology-could represent IBS-D. R/O infectious or inflammatory etiology especially with history of IBD suspect.  #2. GERD with epi pain  #3. Chronic abdominal pain - likely abdominal wall pain, neg CT AP 05/2019, 11/2017 for any SBO.  D/t multiple abdominal surgeries he is expected to have adhesions.  Using abdominal binder.  #4.  IBD suspect. Likely Mild Crohn's disease involving TI. Dx on Colon 01/2016-few erosions in the terminal ileum, mild activity. Bx- neg.  No meds. Asymptomatic.  #5. Large ventral hernia s/p repair complicated by PBSO requiring laparoscopy Jan 2018 at [facility]. Neg CT 05/2019 for any recurrent hernia.  Plan: -Stool studies for GI Pathogen (includes C. Diff), fecal elastase, and Calprotectin) -Continue protonix 40mg  po qd #90, 4RF -Continue Bentyl 10mg  po qid #120 4RF -EGD/colon after cardio clearence, need to hold plavix 5 days, with miralax.   I discussed EGD/Colonoscopy- the indications, risks, alternatives and potential complications including, but not limited to bleeding, infection, reaction to meds, damage to internal organs, cardiac and/or pulmonary problems, and perforation requiring surgery. The possibility that significant findings could be missed was explained. All ? were answered. Pt consents to proceed.   HPI:   57 year old  With CAD s/p stenting 03/16/2019 on plavix (Nl EF), HTN, HLD, borderlike DM, cig smoking  For follow-up visit.  Doing much better overall except for intermittent epigastric/chronic abdominal pain.  Denies having any nausea or vomiting.  No melena or hematochezia.  No odynophagia or dysphagia.  He he has been compliant with Protonix which has improved his symptoms considerably.  Also with intermittent diarrhea more prominently over the last 2 months, denies any nocturnal symptoms.  No melena or hematochezia.  He does  use well water.  Bentyl did help.  No recent antibiotics.  He is due for repeat EGD/colonoscopy.  Previously did not get cardiology clearance in 2020 due to recent stent insertion.  He could not be taken off Plavix.  Most recent stress test was negative.  He continues to smoke despite medical advice.  Being closely followed by Dr. Tomie China.  Seen by Dr. Mathis Bud -per patient had normal labs.  I do see previously scanned reports including normal CBC, CMP.         Past GI procedures:  CT AP with contrast 05/11/2019 IMPRESSION: Stable exam.  No acute findings. Stable postop changes in surgical mesh seen in the anterior abdominal wall soft tissues. No evidence of recurrent hernia or mass. Aortic Atherosclerosis (ICD10-I70.0).  - CT A/P 12/01/2017 with p.o. and IV contrast-no acute abnormality, no small bowel obstruction, no abscesses, aortic atherosclerosis. -Colonoscopy 02/09/2016 ([facility])-colonic polyp status post polypectomy, few erosions in TI consistent with history of Crohn's disease.  Mild activity.  Mild pancolonic diverticulosis, sigmoid resection, internal hemorrhoids. -EGD 11/05/2009-healed distal esophageal erosions.  Mild gastritis.  CT AP 05/2019 Stable exam.  No acute findings.   Aortic Atherosclerosis (ICD10-I70.0).  Past Medical History:  Diagnosis Date       GI bleed    Hypertension    Hyperthyroidism    Status post laparoscopy 12/2016   removed mesh from previous hernia repair that was causing a strangulated small intestine.    Past Surgical History:  Procedure Laterality Date   APPENDECTOMY     COLECTOMY     COLONOSCOPY  01/2016   Colonic polyp status post polypectomy. Few erosions in the TI consistent  with history of Crohns Disease. Mild pancolonic diverticulosis. Internal hemorrhoids.    COLOSTOMY REVERSAL     CORONARY STENT INTERVENTION N/A 03/16/2019   Procedure: CORONARY STENT INTERVENTION;  Surgeon: Tonny Bollman, MD;  Location: [facility] INVASIVE CV LAB;   Service: Cardiovascular;  Laterality: N/A;   [place]     HERNIA REPAIR     LEFT HEART CATH AND CORONARY ANGIOGRAPHY N/A 03/16/2019   Procedure: LEFT HEART CATH AND CORONARY ANGIOGRAPHY;  Surgeon: Tonny Bollman, MD;  Location: [facility] INVASIVE CV LAB;  Service: Cardiovascular;  Laterality: N/A;   UPPER GI ENDOSCOPY  2011   Healed esophageal erosions. Mild gastritis.    WISDOM TOOTH EXTRACTION      Family History  Problem Relation Age of Onset   Hypothyroidism Mother    Hypertension Mother    Aneurysm Mother    Breast cancer Sister    Bone cancer Brother    Aneurysm Brother    Bone cancer Maternal Grandmother     Social History   Tobacco Use   Smoking status: Some Days    Packs/day: .5    Types: Cigarettes    Last attempt to quit: 09/03/2019    Years since quitting: 3.1   Smokeless tobacco: Never  Vaping Use   Vaping Use: Former  Substance Use Topics   Alcohol use: Not Currently   Drug use: Never    Current Outpatient Medications  Medication Sig Dispense Refill   acetaminophen (TYLENOL) 500 MG tablet Take 1,000 mg by mouth every 6 (six) hours as needed for pain (pain).     atorvastatin (LIPITOR) 20 MG tablet Take 20 mg by mouth daily.     buPROPion (WELLBUTRIN XL) 150 MG 24 hr tablet Take 150 mg by mouth daily.     clopidogrel (PLAVIX) 75 MG tablet Take 1 tablet (75 mg total) by mouth daily. Patient must keep appointment on 11-10-22 for further refills, 1 st attempt 30 tablet 0   dicyclomine (BENTYL) 10 MG capsule Take 1 capsule (10 mg total) by mouth 4 (four) times daily -  before meals and at bedtime. 360 capsule 1   docusate sodium (COLACE) 100 MG capsule Take 100 mg by mouth daily.     donepezil (ARICEPT) 10 MG tablet Take 10 mg by mouth daily.     fluticasone (FLONASE) 50 MCG/ACT nasal spray Place 1 spray into both nostrils daily.      furosemide (LASIX) 20 MG tablet Take 40 mg by mouth daily.      gabapentin (NEURONTIN) 300 MG capsule Take 300 mg by mouth 2 (two) times  daily as needed for pain (pain).     losartan-hydrochlorothiazide (HYZAAR) 100-25 MG tablet Take 1 tablet by mouth daily. 90 tablet 0   metFORMIN (GLUCOPHAGE-XR) 500 MG 24 hr tablet Take 500 mg by mouth 2 (two) times daily.     methimazole (TAPAZOLE) 5 MG tablet Take 5 mg by mouth daily.     montelukast (SINGULAIR) 10 MG tablet Take 10 mg by mouth at bedtime.     Multiple Vitamins-Minerals (MULTIVITAMIN WITH MINERALS) tablet Take 1 tablet by mouth daily.     pantoprazole (PROTONIX) 40 MG tablet Take 1 tablet (40 mg total) by mouth daily. 90 tablet 4   pregabalin (LYRICA) 75 MG capsule Take 75 mg by mouth 2 (two) times daily.     tiZANidine (ZANAFLEX) 4 MG tablet Take 4 mg by mouth at bedtime.     Vitamin D, Ergocalciferol, (DRISDOL) 50000 units CAPS capsule Take 50,000 Units  by mouth every 7 (seven) days.     albuterol (VENTOLIN HFA) 108 (90 Base) MCG/ACT inhaler Inhale 2 puffs into the lungs daily.     EPINEPHrine 0.3 mg/0.3 mL IJ SOAJ injection Inject 0.3 mg into the muscle as needed for anaphylaxis. (Patient not taking: Reported on 09/14/2022)     tamsulosin (FLOMAX) 0.4 MG CAPS capsule Take 0.4 mg by mouth daily.     Current Facility-Administered Medications  Medication Dose Route Frequency Provider Last Rate Last Admin   0.9 %  sodium chloride infusion  500 mL Intravenous Once Lynann BolognaGupta, Chenita Ruda, MD        No Known Allergies  Review of Systems:  neg     Physical Exam:    BP 123/68 (BP Location: Left Arm, Patient Position: Sitting, Cuff Size: Normal)   Pulse (!) 50   Temp 98 F (36.7 C) (Temporal)   Ht 6\' 1"  (1.854 m)   Wt 232 lb (105.2 kg)   SpO2 98%   BMI 30.61 kg/m  Filed Weights   11/08/22 1029  Weight: 232 lb (105.2 kg)   Constitutional:  Well-developed, in no acute distress. Psychiatric: Normal mood and affect. Behavior is normal. HEENT: Pupils normal.  Conjunctivae are normal. No scleral icterus. Cardiovascular: Normal rate, regular rhythm. No edema Pulmonary/chest:  Effort normal and breath sounds normal. No wheezing, rales or rhonchi. Abdominal: Soft, nondistended.  Generalized abdominal tenderness. bowel sounds active throughout. There are no masses palpable. No hepatomegaly.  Multiple well-healed scars  Rectal:  defered Neurological: Alert and oriented to person place and time. Skin: Skin is warm and dry. No rashes noted.    Calvin Circleaj Jaide Hillenburg, MD   Cc: Dr Mathis BudUppin

## 2022-11-09 ENCOUNTER — Telehealth: Payer: Self-pay | Admitting: *Deleted

## 2022-11-09 NOTE — Telephone Encounter (Signed)
  Follow up Call-     11/08/2022   10:29 AM  Call back number  Post procedure Call Back phone  # [phone]  Permission to leave phone message Yes     Patient questions:  Do you have a fever, pain , or abdominal swelling? No. Pain Score  0 *  Have you tolerated food without any problems? Yes.    Have you been able to return to your normal activities? Yes.    Do you have any questions about your discharge instructions: Diet   No. Medications  No. Follow up visit  No.  Do you have questions or concerns about your Care? No.  Actions: * If pain score is 4 or above: No action needed, pain <4.

## 2022-11-10 ENCOUNTER — Encounter: Payer: Self-pay | Admitting: Cardiology

## 2022-11-10 ENCOUNTER — Ambulatory Visit: Payer: Medicare HMO | Attending: Cardiology | Admitting: Cardiology

## 2022-11-10 VITALS — BP 100/74 | HR 54 | Ht 73.0 in | Wt 232.6 lb

## 2022-11-10 DIAGNOSIS — I700 Atherosclerosis of aorta: Secondary | ICD-10-CM

## 2022-11-10 DIAGNOSIS — F17210 Nicotine dependence, cigarettes, uncomplicated: Secondary | ICD-10-CM

## 2022-11-10 DIAGNOSIS — I2510 Atherosclerotic heart disease of native coronary artery without angina pectoris: Secondary | ICD-10-CM | POA: Diagnosis not present

## 2022-11-10 DIAGNOSIS — R0609 Other forms of dyspnea: Secondary | ICD-10-CM

## 2022-11-10 DIAGNOSIS — I10 Essential (primary) hypertension: Secondary | ICD-10-CM

## 2022-11-10 MED ORDER — NITROGLYCERIN 0.4 MG SL SUBL: 0.4000 mg | SUBLINGUAL_TABLET | SUBLINGUAL | 6 refills | Status: AC | PRN

## 2022-11-10 NOTE — Patient Instructions (Addendum)
Medication Instructions:  Your physician has recommended you make the following change in your medication:   Use nitroglycerin 1 tablet placed under the tongue at the first sign of chest pain or an angina attack. 1 tablet may be used every 5 minutes as needed, for up to 15 minutes. Do not take more than 3 tablets in 15 minutes. If pain persist call 911 or go to the nearest ED.   *If you need a refill on your cardiac medications before your next appointment, please call your pharmacy*   Lab Work: None ordered If you have labs (blood work) drawn today and your tests are completely normal, you will receive your results only by: [place] (if you have MyChart) OR A paper copy in the mail If you have any lab test that is abnormal or we need to change your treatment, we will call you to review the results.   Testing/Procedures: You are scheduled for a Myocardial Perfusion Imaging Study.  Please arrive 15 minutes prior to your appointment time for registration and insurance purposes.  The test will take approximately 3 to 4 hours to complete; you may bring reading material.  If someone comes with you to your appointment, they will need to remain in the main lobby due to limited space in the testing area.   How to prepare for your Myocardial Perfusion Test: Do not eat or drink 3 hours prior to your test, except you may have water. Do not consume products containing caffeine (regular or decaffeinated) 12 hours prior to your test. (ex: coffee, chocolate, sodas, tea). Do bring a list of your current medications with you.  If not listed below, you may take your medications as normal. Do wear comfortable clothes (no dresses or overalls) and walking shoes, tennis shoes preferred (No heels or open toe shoes are allowed). Do NOT wear cologne, perfume, aftershave, or lotions (deodorant is allowed). If these instructions are not followed, your test will have to be rescheduled.  If you cannot keep  your appointment, please provide 24 hours notification to the Nuclear Lab, to avoid a possible $50 charge to your account.   Your physician has requested that you have an echocardiogram. Echocardiography is a painless test that uses sound waves to create images of your heart. It provides your doctor with information about the size and shape of your heart and how well your heart's chambers and valves are working. This procedure takes approximately one hour. There are no restrictions for this procedure. Please do NOT wear cologne, perfume, aftershave, or lotions (deodorant is allowed). Please arrive 15 minutes prior to your appointment time.   Follow-Up: At [facility], you and your health needs are our priority.  As part of our continuing mission to provide you with exceptional heart care, we have created designated Provider Care Teams.  These Care Teams include your primary Cardiologist (physician) and Advanced Practice Providers (APPs -  Physician Assistants and Nurse Practitioners) who all work together to provide you with the care you need, when you need it.  We recommend signing up for the patient portal called "MyChart".  Sign up information is provided on this After Visit Summary.  MyChart is used to connect with patients for Virtual Visits (Telemedicine).  Patients are able to view lab/test results, encounter notes, upcoming appointments, etc.  Non-urgent messages can be sent to your provider as well.   To learn more about what you can do with MyChart, go to [email or web address].    Your next  appointment:   9 month(s)  Provider:   Rajan Revankar, MD   Other Instructions  Cardiac Nuclear Scan A cardiac nuclear scan is a test that is done to check the flow of blood to your heart. It is done when you are resting and when you are exercising. The test looks for problems such as: Not enough blood reaching a portion of the heart. The heart muscle not working as it should. You  may need this test if you have: Heart disease. Lab results that are not normal. Had heart surgery or a balloon procedure to open up blocked arteries (angioplasty) or a small mesh tube (stent). Chest pain. Shortness of breath. Had a heart attack. In this test, a special dye (tracer) is put into your bloodstream. The tracer will travel to your heart. A camera will then take pictures of your heart to see how the tracer moves through your heart. This test is usually done at a hospital and takes 2-4 hours. Tell a doctor about: Any allergies you have. All medicines you are taking, including vitamins, herbs, eye drops, creams, and over-the-counter medicines. Any bleeding problems you have. Any surgeries you have had. Any medical conditions you have. Whether you are pregnant or may be pregnant. Any history of asthma or long-term (chronic) lung disease. Any history of heart rhythm disorders or heart valve conditions. What are the risks? Your doctor will talk with you about risks. These may include: Serious chest pain and heart attack. This is only a risk if the stress portion of the test is done. Fast or uneven heartbeats (palpitations). A feeling of warmth in your chest. This feeling usually does not last long. Allergic reaction to the tracer. Shortness of breath or trouble breathing. What happens before the test? Ask your doctor about changing or stopping your normal medicines. Follow instructions from your doctor about what you cannot eat or drink. Remove your jewelry on the day of the test. Ask your doctor if you need to avoid nicotine or caffeine. What happens during the test? An IV tube will be inserted into one of your veins. Your doctor will give you a small amount of tracer through the IV tube. You will wait for 20-40 minutes while the tracer moves through your bloodstream. Your heart will be monitored with an electrocardiogram (ECG). You will lie down on an exam table. Pictures of  your heart will be taken for about 15-20 minutes. You may also have a stress test. For this test, one of these things may be done: You will be asked to exercise on a treadmill or a stationary bike. You will be given medicines that will make your heart work harder. This is done if you are unable to exercise. When blood flow to your heart has peaked, a tracer will again be given through the IV tube. After 20-40 minutes, you will get back on the exam table. More pictures will be taken of your heart. Depending on the tracer that is used, more pictures may need to be taken 3-4 hours later. Your IV tube will be removed when the test is over. The test may vary among doctors and hospitals. What happens after the test? Ask your doctor: Whether you can return to your normal schedule, including diet, activities, travel, and medicines. Whether you should drink more fluids. This will help to remove the tracer from your body. Ask your doctor, or the department that is doing the test: When will my results be ready? How will I get   my results? What are my treatment options? What other tests do I need? What are my next steps? This information is not intended to replace advice given to you by your health care provider. Make sure you discuss any questions you have with your health care provider. Document Revised: 12/15/2021 Document Reviewed: 12/15/2021 Elsevier Patient Education  [place].  Echocardiogram An echocardiogram is a test that uses sound waves (ultrasound) to produce images of the heart. Images from an echocardiogram can provide important information about: Heart size and shape. The size and thickness and movement of your heart's walls. Heart muscle function and strength. Heart valve function or if you have stenosis. Stenosis is when the heart valves are too narrow. If blood is flowing backward through the heart valves (regurgitation). A tumor or infectious growth around the heart  valves. Areas of heart muscle that are not working well because of poor blood flow or injury from a heart attack. Aneurysm detection. An aneurysm is a weak or damaged part of an artery wall. The wall bulges out from the normal force of blood pumping through the body. Tell a health care provider about: Any allergies you have. All medicines you are taking, including vitamins, herbs, eye drops, creams, and over-the-counter medicines. Any blood disorders you have. Any surgeries you have had. Any medical conditions you have. Whether you are pregnant or may be pregnant. What are the risks? Generally, this is a safe test. However, problems may occur, including an allergic reaction to dye (contrast) that may be used during the test. What happens before the test? No specific preparation is needed. You may eat and drink normally. What happens during the test?  You will take off your clothes from the waist up and put on a hospital gown. Electrodes or electrocardiogram (ECG)patches may be placed on your chest. The electrodes or patches are then connected to a device that monitors your heart rate and rhythm. You will lie down on a table for an ultrasound exam. A gel will be applied to your chest to help sound waves pass through your skin. A handheld device, called a transducer, will be pressed against your chest and moved over your heart. The transducer produces sound waves that travel to your heart and bounce back (or "echo" back) to the transducer. These sound waves will be captured in real-time and changed into images of your heart that can be viewed on a video monitor. The images will be recorded on a computer and reviewed by your health care provider. You may be asked to change positions or hold your breath for a short time. This makes it easier to get different views or better views of your heart. In some cases, you may receive contrast through an IV in one of your veins. This can improve the quality  of the pictures from your heart. The procedure may vary among health care providers and hospitals. What can I expect after the test? You may return to your normal, everyday life, including diet, activities, and medicines, unless your health care provider tells you not to do that. Follow these instructions at home: It is up to you to get the results of your test. Ask your health care provider, or the department that is doing the test, when your results will be ready. Keep all follow-up visits. This is important. Summary An echocardiogram is a test that uses sound waves (ultrasound) to produce images of the heart. Images from an echocardiogram can provide important information about the size  and shape of your heart, heart muscle function, heart valve function, and other possible heart problems. You do not need to do anything to prepare before this test. You may eat and drink normally. After the echocardiogram is completed, you may return to your normal, everyday life, unless your health care provider tells you not to do that. This information is not intended to replace advice given to you by your health care provider. Make sure you discuss any questions you have with your health care provider. Document Revised: 04/01/2021 Document Reviewed: 03/11/2020 Elsevier Patient Education  [place].

## 2022-11-10 NOTE — Progress Notes (Signed)
Cardiology Office Note:    Date:  11/10/2022   ID:  Calvin Wilkerson, DOB 1966-05-02, MRN [id number]  PCP:  Lucianne Lei, MD  Cardiologist:  Garwin Brothers, MD   Referring MD: Lucianne Lei, MD    ASSESSMENT:    1. Coronary artery disease involving native coronary artery of native heart without angina pectoris   2. Aortic atherosclerosis   3. HTN (hypertension), benign   4. Cigarette smoker   5. DOE (dyspnea on exertion)    PLAN:    In order of problems listed above:  Coronary artery disease: Secondary prevention stressed with the patient.  Importance of compliance with diet medication stressed and vocalized understanding.  He was advised to walk on a regular basis. Dyspnea on exertion: Could be multifactorial but will need to evaluate ischemic substrate in view of significant history.  He is agreeable.  Will do an exercise stress Cardiolite. Essential hypertension: Blood pressure stable and diet was emphasized. Dyslipidemia: On lipid-lowering medications followed by primary care.  Lipids reviewed.  He will be getting blood work in the next few days and send me a copy. Diabetes mellitus and obesity: Weight reduction stressed diet emphasized.  His A1c looks good. Cigarette smoker: I spent 5 minutes with the patient discussing solely about smoking. Smoking cessation was counseled. I suggested to the patient also different medications and pharmacological interventions. Patient is keen to try stopping on its own at this time. He will get back to me if he needs any further assistance in this matter. Sublingual nitroglycerin prescription was sent, its protocol and 911 protocol explained and the patient vocalized understanding questions were answered to the patient's satisfaction Patient will be seen in follow-up appointment in 9 months or earlier if the patient has any concerns.    Medication Adjustments/Labs and Tests Ordered: Current medicines are reviewed at length with the patient  today.  Concerns regarding medicines are outlined above.  Orders Placed This Encounter  Procedures   MYOCARDIAL PERFUSION IMAGING   EKG 12-Lead   ECHOCARDIOGRAM COMPLETE   Meds ordered this encounter  Medications   nitroGLYCERIN (NITROSTAT) 0.4 MG SL tablet    Sig: Place 1 tablet (0.4 mg total) under the tongue every 5 (five) minutes as needed.    Dispense:  25 tablet    Refill:  6     No chief complaint on file.    History of Present Illness:    Calvin Wilkerson is a 57 y.o. male.  Patient has past medical history of coronary artery disease, essential hypertension, dyslipidemia and diabetes mellitus.  He leads a sedentary lifestyle.  Unfortunately continues to smoke.  No chest pain orthopnea or PND.  There is some history of COPD about which I am not clear.  He gives history of dyspnea on exertion which is steady and has not worsened suddenly nevertheless needs an evaluation.  Past Medical History:  Diagnosis Date   Abnormal stress test showing ischemia involving inferior wall 02/19/2019   Angina pectoris 03/09/2019   Aortic atherosclerosis    CAD (coronary artery disease) 07/06/2019   Cellulitis of right knee 07/12/2016   Cigarette smoker 01/15/2019   COPD (chronic obstructive pulmonary disease)    Crohn's disease    DOE (dyspnea on exertion) 01/15/2019   Erectile dysfunction    GERD (gastroesophageal reflux disease)    GI bleed    History of diverticulitis    History of recurrent TIAs    HLD (hyperlipidemia) 12/24/2016   HTN (hypertension), benign 12/24/2016  Hypertension    Hyperthyroidism    Incisional hernia without obstruction or gangrene 12/31/2016   Mood disorder    PVD (peripheral vascular disease)    S/P hernia repair 12/16/2016   Added automatically from request for surgery [id number]  Formatting of this note might be different from the original. Added automatically from request for surgery [id number]   Status post laparoscopy 12/2016   removed mesh from previous hernia  repair that was causing a strangulated small intestine.   TIA (transient ischemic attack) 09/05/2015   Vitamin D deficiency     Past Surgical History:  Procedure Laterality Date   APPENDECTOMY     COLECTOMY     COLONOSCOPY  01/2016   Colonic polyp status post polypectomy. Few erosions in the TI consistent with history of Crohns Disease. Mild pancolonic diverticulosis. Internal hemorrhoids.    COLOSTOMY REVERSAL     CORONARY STENT INTERVENTION N/A 03/16/2019   Procedure: CORONARY STENT INTERVENTION;  Surgeon: Tonny Bollman, MD;  Location: [facility] INVASIVE CV LAB;  Service: Cardiovascular;  Laterality: N/A;   [place]     HERNIA REPAIR     LEFT HEART CATH AND CORONARY ANGIOGRAPHY N/A 03/16/2019   Procedure: LEFT HEART CATH AND CORONARY ANGIOGRAPHY;  Surgeon: Tonny Bollman, MD;  Location: [facility] INVASIVE CV LAB;  Service: Cardiovascular;  Laterality: N/A;   UPPER GI ENDOSCOPY  2011   Healed esophageal erosions. Mild gastritis.    WISDOM TOOTH EXTRACTION      Current Medications: Current Meds  Medication Sig   acetaminophen (TYLENOL) 500 MG tablet Take 1,000 mg by mouth every 6 (six) hours as needed for pain (pain).   albuterol (VENTOLIN HFA) 108 (90 Base) MCG/ACT inhaler Inhale 2 puffs into the lungs daily.   atorvastatin (LIPITOR) 20 MG tablet Take 20 mg by mouth daily.   buPROPion (WELLBUTRIN XL) 150 MG 24 hr tablet Take 150 mg by mouth daily.   clopidogrel (PLAVIX) 75 MG tablet Take 1 tablet (75 mg total) by mouth daily. Patient must keep appointment on 11-10-22 for further refills, 1 st attempt   dicyclomine (BENTYL) 10 MG capsule Take 1 capsule (10 mg total) by mouth 4 (four) times daily -  before meals and at bedtime.   docusate sodium (COLACE) 100 MG capsule Take 100 mg by mouth daily.   donepezil (ARICEPT) 10 MG tablet Take 10 mg by mouth daily.   EPINEPHrine 0.3 mg/0.3 mL IJ SOAJ injection Inject 0.3 mg into the muscle as needed for anaphylaxis.   fluticasone (FLONASE) 50 MCG/ACT  nasal spray Place 1 spray into both nostrils daily.    furosemide (LASIX) 20 MG tablet Take 40 mg by mouth daily.    gabapentin (NEURONTIN) 300 MG capsule Take 300 mg by mouth 2 (two) times daily as needed for pain (pain).   losartan-hydrochlorothiazide (HYZAAR) 100-25 MG tablet Take 1 tablet by mouth daily.   metFORMIN (GLUCOPHAGE-XR) 500 MG 24 hr tablet Take 500 mg by mouth 2 (two) times daily.   methimazole (TAPAZOLE) 5 MG tablet Take 5 mg by mouth daily.   montelukast (SINGULAIR) 10 MG tablet Take 10 mg by mouth at bedtime.   Multiple Vitamins-Minerals (MULTIVITAMIN WITH MINERALS) tablet Take 1 tablet by mouth daily.   nitroGLYCERIN (NITROSTAT) 0.4 MG SL tablet Place 1 tablet (0.4 mg total) under the tongue every 5 (five) minutes as needed.   pantoprazole (PROTONIX) 40 MG tablet Take 1 tablet (40 mg total) by mouth daily.   pregabalin (LYRICA) 75 MG capsule Take 75 mg by  mouth daily.   tamsulosin (FLOMAX) 0.4 MG CAPS capsule Take 0.4 mg by mouth daily.   tiZANidine (ZANAFLEX) 4 MG tablet Take 4 mg by mouth at bedtime.   Vitamin D, Ergocalciferol, (DRISDOL) 50000 units CAPS capsule Take 50,000 Units by mouth every 7 (seven) days.   Current Facility-Administered Medications for the 11/10/22 encounter (Office Visit) with Chesni Vos, Aundra Dubinajan R, MD  Medication   0.9 %  sodium chloride infusion     Allergies:   Patient has no known allergies.   Social History   Socioeconomic History   Marital status: Married    Spouse name: Not on file   Number of children: 5   Years of education: Not on file   Highest education level: Not on file  Occupational History   Occupation: disabled  Tobacco Use   Smoking status: Some Days    Packs/day: .5    Types: Cigarettes    Last attempt to quit: 09/03/2019    Years since quitting: 3.1   Smokeless tobacco: Never  Vaping Use   Vaping Use: Former  Substance and Sexual Activity   Alcohol use: Not Currently   Drug use: Never   Sexual activity: Not on file   Other Topics Concern   Not on file  Social History Narrative   Not on file   Social Determinants of Health   Financial Resource Strain: Not on file  Food Insecurity: Not on file  Transportation Needs: Not on file  Physical Activity: Not on file  Stress: Not on file  Social Connections: Not on file     Family History: The patient's family history includes Aneurysm in his brother and mother; Bone cancer in his brother and maternal grandmother; Breast cancer in his sister; Hypertension in his mother; Hypothyroidism in his mother.  ROS:   Please see the history of present illness.    All other systems reviewed and are negative.  EKGs/Labs/Other Studies Reviewed:    The following studies were reviewed today: EKG reveals sinus rhythm and nonspecific ST-T changes   Recent Labs: No results found for requested labs within last 365 days.  Recent Lipid Panel    Component Value Date/Time   CHOL 131 09/14/2021 0922   TRIG 99 09/14/2021 0922   HDL 41 09/14/2021 0922   CHOLHDL 3.2 09/14/2021 0922   LDLCALC 71 09/14/2021 0922    Physical Exam:    VS:  BP 100/74   Pulse (!) 54   Ht 6\' 1"  (1.854 m)   Wt 232 lb 9.6 oz (105.5 kg)   SpO2 97%   BMI 30.69 kg/m     Wt Readings from Last 3 Encounters:  11/10/22 232 lb 9.6 oz (105.5 kg)  11/08/22 232 lb (105.2 kg)  09/14/22 232 lb 4 oz (105.3 kg)     GEN: Patient is in no acute distress HEENT: Normal NECK: No JVD; No carotid bruits LYMPHATICS: No lymphadenopathy CARDIAC: Hear sounds regular, 2/6 systolic murmur at the apex. RESPIRATORY:  Clear to auscultation without rales, wheezing or rhonchi  ABDOMEN: Soft, non-tender, non-distended MUSCULOSKELETAL:  No edema; No deformity  SKIN: Warm and dry NEUROLOGIC:  Alert and oriented x 3 PSYCHIATRIC:  Normal affect   Signed, Garwin Brothersajan R Rilla Buckman, MD  11/10/2022 8:54 AM    [facility]

## 2022-11-12 DIAGNOSIS — Z79899 Other long term (current) drug therapy: Secondary | ICD-10-CM | POA: Diagnosis not present

## 2022-11-12 DIAGNOSIS — E0500 Thyrotoxicosis with diffuse goiter without thyrotoxic crisis or storm: Secondary | ICD-10-CM | POA: Diagnosis not present

## 2022-11-18 ENCOUNTER — Encounter: Payer: Self-pay | Admitting: Gastroenterology

## 2022-11-22 ENCOUNTER — Other Ambulatory Visit: Payer: Self-pay | Admitting: Cardiology

## 2022-11-25 ENCOUNTER — Telehealth (HOSPITAL_COMMUNITY): Payer: Self-pay | Admitting: *Deleted

## 2022-11-25 NOTE — Telephone Encounter (Signed)
Left detailed instructions for MPI study scheduled on 12/01/22.

## 2022-12-01 ENCOUNTER — Ambulatory Visit: Payer: Medicare HMO | Attending: Cardiology

## 2022-12-01 ENCOUNTER — Ambulatory Visit (INDEPENDENT_AMBULATORY_CARE_PROVIDER_SITE_OTHER): Payer: Medicare HMO

## 2022-12-01 DIAGNOSIS — I2510 Atherosclerotic heart disease of native coronary artery without angina pectoris: Secondary | ICD-10-CM

## 2022-12-01 LAB — MYOCARDIAL PERFUSION IMAGING
Peak HR: 104 {beats}/min
Rest HR: 51 {beats}/min
Stress Nuclear Isotope Dose: 32.7 mCi

## 2022-12-01 LAB — ECHOCARDIOGRAM COMPLETE
Height: 73 in
S' Lateral: 3.7 cm
Weight: 3712 oz

## 2022-12-01 MED ORDER — REGADENOSON 0.4 MG/5ML IV SOLN
0.4000 mg | Freq: Once | INTRAVENOUS | Status: AC
Start: 2022-12-01 — End: 2022-12-01
  Administered 2022-12-01: 0.4 mg via INTRAVENOUS

## 2022-12-01 MED ORDER — TECHNETIUM TC 99M TETROFOSMIN IV KIT
10.1000 | PACK | Freq: Once | INTRAVENOUS | Status: AC | PRN
  Administered 2022-12-01: 10.1 via INTRAVENOUS

## 2022-12-01 MED ORDER — TECHNETIUM TC 99M TETROFOSMIN IV KIT
32.7000 | PACK | Freq: Once | INTRAVENOUS | Status: AC | PRN
  Administered 2022-12-01: 32.7 via INTRAVENOUS

## 2022-12-03 LAB — MYOCARDIAL PERFUSION IMAGING
Base ST Depression (mm): 0.5 mm
LV dias vol: 110 mL (ref 62–150)
LV sys vol: 42 mL
Nuc Stress EF: 62 %
Rest Nuclear Isotope Dose: 10.1 mCi
SDS: 2
SRS: 5
SSS: 7
ST Depression (mm): 1 mm
TID: 0.98

## 2022-12-08 ENCOUNTER — Other Ambulatory Visit: Payer: Self-pay | Admitting: Cardiology

## 2023-02-01 ENCOUNTER — Ambulatory Visit: Payer: Medicare HMO | Admitting: Gastroenterology

## 2023-02-01 ENCOUNTER — Encounter: Payer: Self-pay | Admitting: Gastroenterology

## 2023-02-01 VITALS — BP 100/54 | HR 60 | Ht 73.0 in | Wt 227.1 lb

## 2023-02-01 DIAGNOSIS — R1013 Epigastric pain: Secondary | ICD-10-CM

## 2023-02-01 DIAGNOSIS — K219 Gastro-esophageal reflux disease without esophagitis: Secondary | ICD-10-CM

## 2023-02-01 DIAGNOSIS — R1032 Left lower quadrant pain: Secondary | ICD-10-CM

## 2023-02-01 MED ORDER — OMEPRAZOLE 40 MG PO CPDR: 40.0000 mg | DELAYED_RELEASE_CAPSULE | Freq: Every day | ORAL | 3 refills | Status: AC

## 2023-02-01 NOTE — Patient Instructions (Signed)
_______________________________________________________  If your blood pressure at your visit was 140/90 or greater, please contact your primary care physician to follow up on this.  _______________________________________________________  If you are age 59 or older, your body mass index should be between 23-30. Your Body mass index is 29.97 kg/m. If this is out of the aforementioned range listed, please consider follow up with your Primary Care Provider.  If you are age 40 or younger, your body mass index should be between 19-25. Your Body mass index is 29.97 kg/m. If this is out of the aformentioned range listed, please consider follow up with your Primary Care Provider.   ________________________________________________________  The [facility] providers would like to encourage you to use [facility] to communicate with providers for non-urgent requests or questions.  Due to long hold times on the telephone, sending your provider a message by [facility] may be a faster and more efficient way to get a response.  Please allow 48 business hours for a response.  Please remember that this is for non-urgent requests.  _______________________________________________________  Continue omeprazole. Use heating pads at night and Biofreeze in the morning  Follow up as needed.  Thank you,  Dr. Lynann Bologna

## 2023-02-01 NOTE — Progress Notes (Signed)
Chief Complaint:  FU Abdominal pain  Referring Provider:  Dr Mathis Bud      ASSESSMENT AND PLAN;   #1. Diarrhea (resolved)- ?Etiology-could represent IBS-D. Neg EGD, colon with Bx 11/2022  #2. GERD-well-controlled on Protonix 40 daily  #3. LLQ pain  - likely abdominal wall pain. No hernias  #4. Large ventral hernia s/p repair complicated by PBSO requiring laparoscopy Jan 2018 at [facility]. Neg CT 05/2019 for any recurrent hernia.  Plan: -Continue protonix 40mg  po qd #90, 4RF -Heating pads -Use biofreeze AM -If still with problems, CT AP with contrast. He will let us know in the next few days.  HPI:   57 year old  With CAD s/p stenting 03/16/2019 on plavix (Nl EF), HTN, HLD, DM2, cig smoking, on diability  For follow-up visit.  Doing much better  Minimal diarrhea now.   Had LLQ pain after lifting heavy weight unintentionally Now better No diarrhea or constipation Taking colace.  No nausea, vomiting, heartburn( has it if he misses even a single dose of Protonix.  He does not want decrease dose or try it every other day), regurgitation, odynophagia or dysphagia.  No significant diarrhea or constipation.  No melena or hematochezia. No unintentional weight loss.   Most recent stress test/echo was negative.  Being closely followed by Dr. Tomie China.    Past GI procedures: Colon 11/08/22 - The entire examined colon is normal. Biopsied. - Patent end-to-end colo-colonic anastomosis. - Mild pancolonic diverticulosis. - The examined portion of the ileum was normal. Biopsied. - Non-bleeding internal hemorrhoids. - The examination was otherwise normal on direct and retroflexion views. Bx: Neg for crohns/microscopic colitis. Neg TI Bx  EGD: 11/08/2022 -Nodular gastritis. Biopsied. - Irregular Z-line -Neg Bx for celiac, HP, EoE   CT AP with contrast 05/11/2019 IMPRESSION: Stable exam.  No acute findings. Stable postop changes in surgical mesh seen in the anterior abdominal wall soft  tissues. No evidence of recurrent hernia or mass. Aortic Atherosclerosis (ICD10-I70.0).  - CT A/P 12/01/2017 with p.o. and IV contrast-no acute abnormality, no small bowel obstruction, no abscesses, aortic atherosclerosis. -Colonoscopy 02/09/2016 ([facility])-colonic polyp status post polypectomy, few erosions in TI consistent with history of Crohn's disease.  Mild activity.  Mild pancolonic diverticulosis, sigmoid resection, internal hemorrhoids. -EGD 11/05/2009-healed distal esophageal erosions.  Mild gastritis.  CT AP 05/2019 Stable exam.  No acute findings.   Aortic Atherosclerosis (ICD10-I70.0).  Past Medical History:  Diagnosis Date       GI bleed    Hypertension    Hyperthyroidism    Status post laparoscopy 12/2016   removed mesh from previous hernia repair that was causing a strangulated small intestine.    Past Surgical History:  Procedure Laterality Date   APPENDECTOMY     COLECTOMY     COLONOSCOPY  01/2016   Colonic polyp status post polypectomy. Few erosions in the TI consistent with history of Crohns Disease. Mild pancolonic diverticulosis. Internal hemorrhoids.    COLOSTOMY REVERSAL     CORONARY STENT INTERVENTION N/A 03/16/2019   Procedure: CORONARY STENT INTERVENTION;  Surgeon: Tonny Bollman, MD;  Location: [facility] INVASIVE CV LAB;  Service: Cardiovascular;  Laterality: N/A;   [place]     HERNIA REPAIR     LEFT HEART CATH AND CORONARY ANGIOGRAPHY N/A 03/16/2019   Procedure: LEFT HEART CATH AND CORONARY ANGIOGRAPHY;  Surgeon: Tonny Bollman, MD;  Location: [facility] INVASIVE CV LAB;  Service: Cardiovascular;  Laterality: N/A;   UPPER GI ENDOSCOPY  2011   Healed esophageal erosions. Mild gastritis.  WISDOM TOOTH EXTRACTION      Family History  Problem Relation Age of Onset   Hypothyroidism Mother    Hypertension Mother    Aneurysm Mother    Breast cancer Sister    Bone cancer Brother    Aneurysm Brother    Bone cancer Maternal Grandmother     Social History   Tobacco  Use   Smoking status: Some Days    Packs/day: .5    Types: Cigarettes    Last attempt to quit: 09/03/2019    Years since quitting: 3.4   Smokeless tobacco: Never  Vaping Use   Vaping Use: Former  Substance Use Topics   Alcohol use: Not Currently   Drug use: Never    Current Outpatient Medications  Medication Sig Dispense Refill   acetaminophen (TYLENOL) 500 MG tablet Take 1,000 mg by mouth every 6 (six) hours as needed for pain (pain).     albuterol (VENTOLIN HFA) 108 (90 Base) MCG/ACT inhaler Inhale 2 puffs into the lungs daily.     atorvastatin (LIPITOR) 20 MG tablet Take 20 mg by mouth daily.     buPROPion (WELLBUTRIN XL) 150 MG 24 hr tablet Take 150 mg by mouth daily.     clopidogrel (PLAVIX) 75 MG tablet Take 1 tablet (75 mg total) by mouth daily. 90 tablet 3   dicyclomine (BENTYL) 10 MG capsule Take 1 capsule (10 mg total) by mouth 4 (four) times daily -  before meals and at bedtime. 360 capsule 1   docusate sodium (COLACE) 100 MG capsule Take 100 mg by mouth daily.     donepezil (ARICEPT) 10 MG tablet Take 10 mg by mouth daily.     fluticasone (FLONASE) 50 MCG/ACT nasal spray Place 1 spray into both nostrils daily.      furosemide (LASIX) 20 MG tablet Take 40 mg by mouth daily.      gabapentin (NEURONTIN) 300 MG capsule Take 300 mg by mouth 2 (two) times daily as needed for pain (pain).     losartan-hydrochlorothiazide (HYZAAR) 100-25 MG tablet Take 1 tablet by mouth daily. 90 tablet 2   metFORMIN (GLUCOPHAGE-XR) 500 MG 24 hr tablet Take 500 mg by mouth 2 (two) times daily.     methimazole (TAPAZOLE) 5 MG tablet Take 5 mg by mouth daily.     montelukast (SINGULAIR) 10 MG tablet Take 10 mg by mouth at bedtime.     Multiple Vitamins-Minerals (MULTIVITAMIN WITH MINERALS) tablet Take 1 tablet by mouth daily.     pantoprazole (PROTONIX) 40 MG tablet Take 1 tablet (40 mg total) by mouth daily. 90 tablet 4   pregabalin (LYRICA) 75 MG capsule Take 75 mg by mouth daily.     tamsulosin  (FLOMAX) 0.4 MG CAPS capsule Take 0.4 mg by mouth daily.     tiZANidine (ZANAFLEX) 4 MG tablet Take 4 mg by mouth at bedtime.     Vitamin D, Ergocalciferol, (DRISDOL) 50000 units CAPS capsule Take 50,000 Units by mouth every 7 (seven) days.     EPINEPHrine 0.3 mg/0.3 mL IJ SOAJ injection Inject 0.3 mg into the muscle as needed for anaphylaxis. (Patient not taking: Reported on 02/01/2023)     nitroGLYCERIN (NITROSTAT) 0.4 MG SL tablet Place 1 tablet (0.4 mg total) under the tongue every 5 (five) minutes as needed. (Patient not taking: Reported on 02/01/2023) 25 tablet 6   Current Facility-Administered Medications  Medication Dose Route Frequency Provider Last Rate Last Admin   0.9 %  sodium chloride infusion  500 mL Intravenous Once Lynann Bologna, MD        No Known Allergies  Review of Systems:  neg     Physical Exam:    BP (!) 100/54 (BP Location: Left Arm, Patient Position: Sitting, Cuff Size: Normal)   Pulse 60   Ht 6\' 1"  (1.854 m)   Wt 227 lb 2 oz (103 kg)   BMI 29.97 kg/m  Filed Weights   02/01/23 1325  Weight: 227 lb 2 oz (103 kg)   Constitutional:  Well-developed, in no acute distress. Psychiatric: Normal mood and affect. Behavior is normal. HEENT: Pupils normal.  Conjunctivae are normal. No scleral icterus. Cardiovascular: Normal rate, regular rhythm. No edema Pulmonary/chest: Effort normal and breath sounds normal. No wheezing, rales or rhonchi. Abdominal: Soft, nondistended.  Generalized abdominal tenderness. bowel sounds active throughout. There are no masses palpable. No hepatomegaly.  Multiple well-healed scars .  No hernias. Rectal:  defered Neurological: Alert and oriented to person place and time. Skin: Skin is warm and dry. No rashes noted.    Calvin Circle, MD   Cc: Dr Mathis Bud

## 2023-02-07 DIAGNOSIS — I10 Essential (primary) hypertension: Secondary | ICD-10-CM | POA: Diagnosis not present

## 2023-02-07 DIAGNOSIS — I739 Peripheral vascular disease, unspecified: Secondary | ICD-10-CM | POA: Diagnosis not present

## 2023-02-07 DIAGNOSIS — G479 Sleep disorder, unspecified: Secondary | ICD-10-CM | POA: Diagnosis not present

## 2023-02-07 DIAGNOSIS — I700 Atherosclerosis of aorta: Secondary | ICD-10-CM | POA: Diagnosis not present

## 2023-02-07 DIAGNOSIS — M545 Low back pain, unspecified: Secondary | ICD-10-CM | POA: Diagnosis not present

## 2023-02-07 DIAGNOSIS — F419 Anxiety disorder, unspecified: Secondary | ICD-10-CM | POA: Diagnosis not present

## 2023-02-07 DIAGNOSIS — E559 Vitamin D deficiency, unspecified: Secondary | ICD-10-CM | POA: Diagnosis not present

## 2023-02-07 DIAGNOSIS — E785 Hyperlipidemia, unspecified: Secondary | ICD-10-CM | POA: Diagnosis not present

## 2023-02-07 DIAGNOSIS — E1140 Type 2 diabetes mellitus with diabetic neuropathy, unspecified: Secondary | ICD-10-CM | POA: Diagnosis not present

## 2023-03-02 ENCOUNTER — Other Ambulatory Visit: Payer: Self-pay | Admitting: Gastroenterology

## 2023-03-31 DIAGNOSIS — E559 Vitamin D deficiency, unspecified: Secondary | ICD-10-CM | POA: Diagnosis not present

## 2023-03-31 DIAGNOSIS — J449 Chronic obstructive pulmonary disease, unspecified: Secondary | ICD-10-CM | POA: Diagnosis not present

## 2023-03-31 DIAGNOSIS — E1140 Type 2 diabetes mellitus with diabetic neuropathy, unspecified: Secondary | ICD-10-CM | POA: Diagnosis not present

## 2023-03-31 DIAGNOSIS — I739 Peripheral vascular disease, unspecified: Secondary | ICD-10-CM | POA: Diagnosis not present

## 2023-03-31 DIAGNOSIS — E785 Hyperlipidemia, unspecified: Secondary | ICD-10-CM | POA: Diagnosis not present

## 2023-03-31 DIAGNOSIS — Z79899 Other long term (current) drug therapy: Secondary | ICD-10-CM | POA: Diagnosis not present

## 2023-03-31 DIAGNOSIS — G479 Sleep disorder, unspecified: Secondary | ICD-10-CM | POA: Diagnosis not present

## 2023-03-31 DIAGNOSIS — I700 Atherosclerosis of aorta: Secondary | ICD-10-CM | POA: Diagnosis not present

## 2023-03-31 DIAGNOSIS — I10 Essential (primary) hypertension: Secondary | ICD-10-CM | POA: Diagnosis not present

## 2023-03-31 DIAGNOSIS — M545 Low back pain, unspecified: Secondary | ICD-10-CM | POA: Diagnosis not present

## 2023-03-31 DIAGNOSIS — F419 Anxiety disorder, unspecified: Secondary | ICD-10-CM | POA: Diagnosis not present

## 2023-05-02 DIAGNOSIS — M545 Low back pain, unspecified: Secondary | ICD-10-CM | POA: Diagnosis not present

## 2023-05-02 DIAGNOSIS — Z6830 Body mass index (BMI) 30.0-30.9, adult: Secondary | ICD-10-CM | POA: Diagnosis not present

## 2023-05-02 DIAGNOSIS — R109 Unspecified abdominal pain: Secondary | ICD-10-CM | POA: Diagnosis not present

## 2023-05-02 DIAGNOSIS — M7989 Other specified soft tissue disorders: Secondary | ICD-10-CM | POA: Diagnosis not present

## 2023-05-19 DIAGNOSIS — Z79899 Other long term (current) drug therapy: Secondary | ICD-10-CM | POA: Diagnosis not present

## 2023-05-19 DIAGNOSIS — E0500 Thyrotoxicosis with diffuse goiter without thyrotoxic crisis or storm: Secondary | ICD-10-CM | POA: Diagnosis not present

## 2023-06-01 DIAGNOSIS — Z6830 Body mass index (BMI) 30.0-30.9, adult: Secondary | ICD-10-CM | POA: Diagnosis not present

## 2023-06-01 DIAGNOSIS — M545 Low back pain, unspecified: Secondary | ICD-10-CM | POA: Diagnosis not present

## 2023-06-23 DIAGNOSIS — Z9181 History of falling: Secondary | ICD-10-CM | POA: Diagnosis not present

## 2023-06-23 DIAGNOSIS — Z0000 Encounter for general adult medical examination without abnormal findings: Secondary | ICD-10-CM | POA: Diagnosis not present

## 2023-08-01 ENCOUNTER — Other Ambulatory Visit: Payer: Self-pay | Admitting: Gastroenterology

## 2023-08-01 ENCOUNTER — Other Ambulatory Visit: Payer: Self-pay | Admitting: Cardiology

## 2023-08-17 DIAGNOSIS — I10 Essential (primary) hypertension: Secondary | ICD-10-CM | POA: Diagnosis not present

## 2023-08-17 DIAGNOSIS — E1140 Type 2 diabetes mellitus with diabetic neuropathy, unspecified: Secondary | ICD-10-CM | POA: Diagnosis not present

## 2023-08-17 DIAGNOSIS — I739 Peripheral vascular disease, unspecified: Secondary | ICD-10-CM | POA: Diagnosis not present

## 2023-08-17 DIAGNOSIS — I700 Atherosclerosis of aorta: Secondary | ICD-10-CM | POA: Diagnosis not present

## 2023-08-17 DIAGNOSIS — M129 Arthropathy, unspecified: Secondary | ICD-10-CM | POA: Diagnosis not present

## 2023-08-17 DIAGNOSIS — M545 Low back pain, unspecified: Secondary | ICD-10-CM | POA: Diagnosis not present

## 2023-08-17 DIAGNOSIS — G479 Sleep disorder, unspecified: Secondary | ICD-10-CM | POA: Diagnosis not present

## 2023-08-17 DIAGNOSIS — K439 Ventral hernia without obstruction or gangrene: Secondary | ICD-10-CM | POA: Diagnosis not present

## 2023-08-17 DIAGNOSIS — E785 Hyperlipidemia, unspecified: Secondary | ICD-10-CM | POA: Diagnosis not present

## 2023-09-20 DIAGNOSIS — E119 Type 2 diabetes mellitus without complications: Secondary | ICD-10-CM | POA: Diagnosis not present

## 2023-10-05 ENCOUNTER — Ambulatory Visit: Payer: Medicare HMO | Attending: Cardiology | Admitting: Cardiology

## 2023-10-05 ENCOUNTER — Encounter: Payer: Self-pay | Admitting: Cardiology

## 2023-10-05 VITALS — BP 108/66 | HR 64 | Ht 73.0 in | Wt 247.0 lb

## 2023-10-05 DIAGNOSIS — F17210 Nicotine dependence, cigarettes, uncomplicated: Secondary | ICD-10-CM | POA: Diagnosis not present

## 2023-10-05 DIAGNOSIS — I2510 Atherosclerotic heart disease of native coronary artery without angina pectoris: Secondary | ICD-10-CM | POA: Diagnosis not present

## 2023-10-05 DIAGNOSIS — I10 Essential (primary) hypertension: Secondary | ICD-10-CM | POA: Diagnosis not present

## 2023-10-05 DIAGNOSIS — E782 Mixed hyperlipidemia: Secondary | ICD-10-CM

## 2023-10-05 DIAGNOSIS — R0609 Other forms of dyspnea: Secondary | ICD-10-CM | POA: Diagnosis not present

## 2023-10-05 NOTE — Patient Instructions (Signed)
 Medication Instructions:  Your physician recommends that you continue on your current medications as directed. Please refer to the Current Medication list given to you today.  *If you need a refill on your cardiac medications before your next appointment, please call your pharmacy*   Lab Work: None Ordered If you have labs (blood work) drawn today and your tests are completely normal, you will receive your results only by: MyChart Message (if you have MyChart) OR A paper copy in the mail If you have any lab test that is abnormal or we need to change your treatment, we will call you to review the results.   Testing/Procedures: Echocardiogram An echocardiogram is a test that uses sound waves (ultrasound) to produce images of the heart. Images from an echocardiogram can provide important information about: Heart size and shape. The size and thickness and movement of your heart's walls. Heart muscle function and strength. Heart valve function or if you have stenosis. Stenosis is when the heart valves are too narrow. If blood is flowing backward through the heart valves (regurgitation). A tumor or infectious growth around the heart valves. Areas of heart muscle that are not working well because of poor blood flow or injury from a heart attack. Aneurysm detection. An aneurysm is a weak or damaged part of an artery wall. The wall bulges out from the normal force of blood pumping through the body. Tell a health care provider about: Any allergies you have. All medicines you are taking, including vitamins, herbs, eye drops, creams, and over-the-counter medicines. Any blood disorders you have. Any surgeries you have had. Any medical conditions you have. Whether you are pregnant or may be pregnant. What are the risks? Generally, this is a safe test. However, problems may occur, including an allergic reaction to dye (contrast) that may be used during the test. What happens before the test? No  specific preparation is needed. You may eat and drink normally. What happens during the test?  You will take off your clothes from the waist up and put on a hospital gown. Electrodes or electrocardiogram (ECG)patches may be placed on your chest. The electrodes or patches are then connected to a device that monitors your heart rate and rhythm. You will lie down on a table for an ultrasound exam. A gel will be applied to your chest to help sound waves pass through your skin. A handheld device, called a transducer, will be pressed against your chest and moved over your heart. The transducer produces sound waves that travel to your heart and bounce back (or "echo" back) to the transducer. These sound waves will be captured in real-time and changed into images of your heart that can be viewed on a video monitor. The images will be recorded on a computer and reviewed by your health care provider. You may be asked to change positions or hold your breath for a short time. This makes it easier to get different views or better views of your heart. In some cases, you may receive contrast through an IV in one of your veins. This can improve the quality of the pictures from your heart. The procedure may vary among health care providers and hospitals. What can I expect after the test? You may return to your normal, everyday life, including diet, activities, and medicines, unless your health care provider tells you not to do that. Follow these instructions at home: It is up to you to get the results of your test. Ask your health care provider,  or the department that is doing the test, when your results will be ready. Keep all follow-up visits. This is important. Summary An echocardiogram is a test that uses sound waves (ultrasound) to produce images of the heart. Images from an echocardiogram can provide important information about the size and shape of your heart, heart muscle function, heart valve function, and  other possible heart problems. You do not need to do anything to prepare before this test. You may eat and drink normally. After the echocardiogram is completed, you may return to your normal, everyday life, unless your health care provider tells you not to do that. This information is not intended to replace advice given to you by your health care provider. Make sure you discuss any questions you have with your health care provider. Document Revised: 04/01/2021 Document Reviewed: 03/11/2020 Elsevier Patient Education  2023 Elsevier Inc.       Follow-Up: At [facility], you and your health needs are our priority.  As part of our continuing mission to provide you with exceptional heart care, we have created designated Provider Care Teams.  These Care Teams include your primary Cardiologist (physician) and Advanced Practice Providers (APPs -  Physician Assistants and Nurse Practitioners) who all work together to provide you with the care you need, when you need it.  We recommend signing up for the patient portal called "MyChart".  Sign up information is provided on this After Visit Summary.  MyChart is used to connect with patients for Virtual Visits (Telemedicine).  Patients are able to view lab/test results, encounter notes, upcoming appointments, etc.  Non-urgent messages can be sent to your provider as well.   To learn more about what you can do with MyChart, go to [email or web address].    Your next appointment:   12 month follow up

## 2023-10-05 NOTE — Progress Notes (Signed)
 Cardiology Office Note:    Date:  10/05/2023   ID:  Calvin Wilkerson, DOB Aug 13, 1965, MRN [id number]  PCP:  Lucianne Lei, MD  Cardiologist:  Garwin Brothers, MD   Referring MD: Lucianne Lei, MD    ASSESSMENT:    1. HTN (hypertension), benign   2. Coronary artery disease involving native coronary artery of native heart without angina pectoris   3. Cigarette smoker   4. Mixed hyperlipidemia    PLAN:    In order of problems listed above:  Coronary artery disease: Secondary prevention stressed with the patient.  Importance of compliance with diet medication stressed any vocalized understanding.  He was advised to walk at least half an hour a day on a daily basis and he promises to try. Cigarette smoke: I spent 5 minutes with the patient discussing solely about smoking. Smoking cessation was counseled. I suggested to the patient also different medications and pharmacological interventions. Patient is keen to try stopping on its own at this time. He will get back to me if he needs any further assistance in this matter. Cardiac murmur: Echocardiogram will be done to assess murmur heard on auscultation. Mixed dyslipidemia: On lipid-lowering medications followed by primary care.  I offered to do blood work but he tells me that he will get it done at primary care.  Goal LDL is less than 70. Essential hypertension: Blood pressure is stable and diet was emphasized. Patient will be seen in follow-up appointment in 12 months or earlier if the patient has any concerns.    Medication Adjustments/Labs and Tests Ordered: Current medicines are reviewed at length with the patient today.  Concerns regarding medicines are outlined above.  Orders Placed This Encounter  Procedures   EKG 12-Lead   No orders of the defined types were placed in this encounter.    Chief Complaint  Patient presents with   yearly follow up     History of Present Illness:    Calvin Wilkerson is a 58 y.o. male.  Patient  has past medical history of coronary artery disease, essential hypertension, mixed dyslipidemia and unfortunately continues to smoke.  He denies any chest pain orthopnea or PND.  At the time of my evaluation, the patient is alert awake oriented and in no distress.  He leads a sedentary lifestyle and tells me that he does not exercise because he takes care of his wife with dementia.  Past Medical History:  Diagnosis Date   Abnormal stress test showing ischemia involving inferior wall 02/19/2019   Angina pectoris (HCC) 03/09/2019   Aortic atherosclerosis (HCC)    CAD (coronary artery disease) 07/06/2019   Cellulitis of right knee 07/12/2016   Cigarette smoker 01/15/2019   COPD (chronic obstructive pulmonary disease) (HCC)    Crohn's disease (HCC)    DOE (dyspnea on exertion) 01/15/2019   Erectile dysfunction    GERD (gastroesophageal reflux disease)    GI bleed    History of diverticulitis    History of recurrent TIAs    HLD (hyperlipidemia) 12/24/2016   HTN (hypertension), benign 12/24/2016   Hypertension    Hyperthyroidism    Incisional hernia without obstruction or gangrene 12/31/2016   Mood disorder (HCC)    PVD (peripheral vascular disease) (HCC)    S/P hernia repair 12/16/2016   Added automatically from request for surgery [id number]  Formatting of this note might be different from the original. Added automatically from request for surgery [id number]   Status post laparoscopy 12/2016   removed  mesh from previous hernia repair that was causing a strangulated small intestine.   TIA (transient ischemic attack) 09/05/2015   Vitamin D deficiency     Past Surgical History:  Procedure Laterality Date   APPENDECTOMY     COLECTOMY     COLONOSCOPY  01/2016   Colonic polyp status post polypectomy. Few erosions in the TI consistent with history of Crohns Disease. Mild pancolonic diverticulosis. Internal hemorrhoids.    COLOSTOMY REVERSAL     CORONARY STENT INTERVENTION N/A 03/16/2019   Procedure:  CORONARY STENT INTERVENTION;  Surgeon: Tonny Bollman, MD;  Location: [facility] INVASIVE CV LAB;  Service: Cardiovascular;  Laterality: N/A;   [place]     HERNIA REPAIR     LEFT HEART CATH AND CORONARY ANGIOGRAPHY N/A 03/16/2019   Procedure: LEFT HEART CATH AND CORONARY ANGIOGRAPHY;  Surgeon: Tonny Bollman, MD;  Location: [facility] INVASIVE CV LAB;  Service: Cardiovascular;  Laterality: N/A;   UPPER GI ENDOSCOPY  2011   Healed esophageal erosions. Mild gastritis.    WISDOM TOOTH EXTRACTION      Current Medications: Current Meds  Medication Sig   albuterol (VENTOLIN HFA) 108 (90 Base) MCG/ACT inhaler Inhale 2 puffs into the lungs daily as needed for wheezing or shortness of breath.   atorvastatin (LIPITOR) 20 MG tablet Take 20 mg by mouth daily.   buPROPion (WELLBUTRIN XL) 150 MG 24 hr tablet Take 150 mg by mouth daily.   clopidogrel (PLAVIX) 75 MG tablet Take 1 tablet (75 mg total) by mouth daily.   dicyclomine (BENTYL) 10 MG capsule TAKE 1 CAPSULE FOUR TIMES DAILY - BEFORE MEALS AND AT BEDTIME. (Patient taking differently: Take 10 mg by mouth 3 (three) times daily before meals.)   docusate sodium (COLACE) 100 MG capsule Take 100 mg by mouth daily.   donepezil (ARICEPT) 10 MG tablet Take 10 mg by mouth daily.   EPINEPHrine 0.3 mg/0.3 mL IJ SOAJ injection Inject 0.3 mg into the muscle as needed for anaphylaxis.   fluticasone (FLONASE) 50 MCG/ACT nasal spray Place 1 spray into both nostrils daily.    furosemide (LASIX) 20 MG tablet Take 20 mg by mouth daily.   gabapentin (NEURONTIN) 300 MG capsule Take 300 mg by mouth 2 (two) times daily as needed for pain (pain).   losartan-hydrochlorothiazide (HYZAAR) 100-25 MG tablet Take 1 tablet by mouth daily.   metFORMIN (GLUCOPHAGE-XR) 500 MG 24 hr tablet Take 500 mg by mouth daily with breakfast.   methimazole (TAPAZOLE) 5 MG tablet Take 5 mg by mouth daily.   montelukast (SINGULAIR) 10 MG tablet Take 10 mg by mouth at bedtime.   Multiple Vitamins-Minerals  (MULTIVITAMIN WITH MINERALS) tablet Take 1 tablet by mouth daily.   nitroGLYCERIN (NITROSTAT) 0.4 MG SL tablet Place 1 tablet (0.4 mg total) under the tongue every 5 (five) minutes as needed. (Patient taking differently: Place 0.4 mg under the tongue every 5 (five) minutes as needed for chest pain.)   omeprazole (PRILOSEC) 40 MG capsule Take 1 capsule (40 mg total) by mouth daily.   pregabalin (LYRICA) 75 MG capsule Take 75 mg by mouth daily.   tamsulosin (FLOMAX) 0.4 MG CAPS capsule Take 0.4 mg by mouth daily.   tiZANidine (ZANAFLEX) 4 MG tablet Take 4 mg by mouth at bedtime.   Vitamin D, Ergocalciferol, (DRISDOL) 50000 units CAPS capsule Take 50,000 Units by mouth every 7 (seven) days.   [DISCONTINUED] acetaminophen (TYLENOL) 500 MG tablet Take 1,000 mg by mouth every 6 (six) hours as needed for pain (pain).   [  DISCONTINUED] pantoprazole (PROTONIX) 40 MG tablet Take 1 tablet (40 mg total) by mouth daily.   Current Facility-Administered Medications for the 10/05/23 encounter (Office Visit) with Adalai Perl, Aundra Dubin, MD  Medication   0.9 %  sodium chloride infusion     Allergies:   Patient has no known allergies.   Social History   Socioeconomic History   Marital status: Married    Spouse name: Not on file   Number of children: 5   Years of education: Not on file   Highest education level: Not on file  Occupational History   Occupation: disabled  Tobacco Use   Smoking status: Some Days    Current packs/day: 0.00    Types: Cigarettes    Last attempt to quit: 09/03/2019    Years since quitting: 4.0   Smokeless tobacco: Never  Vaping Use   Vaping status: Former  Substance and Sexual Activity   Alcohol use: Not Currently   Drug use: Never   Sexual activity: Not on file  Other Topics Concern   Not on file  Social History Narrative   Not on file   Social Drivers of Health   Financial Resource Strain: Not on file  Food Insecurity: Not on file  Transportation Needs: Not on file   Physical Activity: Not on file  Stress: Not on file  Social Connections: Not on file     Family History: The patient's family history includes Aneurysm in his brother and mother; Bone cancer in his brother and maternal grandmother; Breast cancer in his sister; Hypertension in his mother; Hypothyroidism in his mother.  ROS:   Please see the history of present illness.    All other systems reviewed and are negative.  EKGs/Labs/Other Studies Reviewed:    The following studies were reviewed today: .Marland KitchenEKG Interpretation Date/Time:  Wednesday October 05 2023 13:07:51 EST Ventricular Rate:  64 PR Interval:  164 QRS Duration:  88 QT Interval:  410 QTC Calculation: 422 R Axis:   68  Text Interpretation: Normal sinus rhythm Nonspecific ST and T wave abnormality Abnormal ECG When compared with ECG of 16-Mar-2019 11:00, Nonspecific T wave abnormality, worse in Lateral leads Confirmed by Belva Crome [phone]) on 10/05/2023 1:21:47 PM     Recent Labs: No results found for requested labs within last 365 days.  Recent Lipid Panel    Component Value Date/Time   CHOL 131 09/14/2021 0922   TRIG 99 09/14/2021 0922   HDL 41 09/14/2021 0922   CHOLHDL 3.2 09/14/2021 0922   LDLCALC 71 09/14/2021 0922    Physical Exam:    VS:  BP 108/66 (BP Location: Right Arm, Patient Position: Sitting)   Pulse 64   Ht 6\' 1"  (1.854 m)   Wt 247 lb (112 kg)   SpO2 95%   BMI 32.59 kg/m     Wt Readings from Last 3 Encounters:  10/05/23 247 lb (112 kg)  02/01/23 227 lb 2 oz (103 kg)  12/01/22 232 lb (105.2 kg)     GEN: Patient is in no acute distress HEENT: Normal NECK: No JVD; No carotid bruits LYMPHATICS: No lymphadenopathy CARDIAC: Hear sounds regular, 2/6 systolic murmur at the apex. RESPIRATORY:  Clear to auscultation without rales, wheezing or rhonchi  ABDOMEN: Soft, non-tender, non-distended MUSCULOSKELETAL:  No edema; No deformity  SKIN: Warm and dry NEUROLOGIC:  Alert and oriented x  3 PSYCHIATRIC:  Normal affect   Signed, Garwin Brothers, MD  10/05/2023 1:22 PM    [facility]  [facility]

## 2023-11-01 ENCOUNTER — Telehealth: Payer: Self-pay

## 2023-11-01 ENCOUNTER — Ambulatory Visit: Attending: Cardiology

## 2023-11-01 DIAGNOSIS — I10 Essential (primary) hypertension: Secondary | ICD-10-CM | POA: Diagnosis not present

## 2023-11-01 DIAGNOSIS — R0609 Other forms of dyspnea: Secondary | ICD-10-CM

## 2023-11-01 LAB — ECHOCARDIOGRAM COMPLETE
AR max vel: 1.87 cm2
AV Area VTI: 1.69 cm2
AV Area mean vel: 1.83 cm2
AV Mean grad: 7.7 mmHg
AV Peak grad: 15.8 mmHg
Ao pk vel: 1.99 m/s
Area-P 1/2: 3.32 cm2
S' Lateral: 3.1 cm

## 2023-11-01 NOTE — Telephone Encounter (Signed)
 Left VM to return call

## 2023-11-01 NOTE — Telephone Encounter (Signed)
-----   Message from Aundra Dubin Revankar sent at 11/01/2023 12:01 PM EDT ----- The results of the study is unremarkable. Please inform patient. I will discuss in detail at next appointment. Cc  primary care/referring physician Garwin Brothers, MD 11/01/2023 12:01 PM

## 2023-11-24 DIAGNOSIS — M129 Arthropathy, unspecified: Secondary | ICD-10-CM | POA: Diagnosis not present

## 2023-11-24 DIAGNOSIS — I739 Peripheral vascular disease, unspecified: Secondary | ICD-10-CM | POA: Diagnosis not present

## 2023-11-24 DIAGNOSIS — Z79899 Other long term (current) drug therapy: Secondary | ICD-10-CM | POA: Diagnosis not present

## 2023-11-24 DIAGNOSIS — E1140 Type 2 diabetes mellitus with diabetic neuropathy, unspecified: Secondary | ICD-10-CM | POA: Diagnosis not present

## 2023-11-24 DIAGNOSIS — E785 Hyperlipidemia, unspecified: Secondary | ICD-10-CM | POA: Diagnosis not present

## 2023-11-24 DIAGNOSIS — I700 Atherosclerosis of aorta: Secondary | ICD-10-CM | POA: Diagnosis not present

## 2023-11-24 DIAGNOSIS — G479 Sleep disorder, unspecified: Secondary | ICD-10-CM | POA: Diagnosis not present

## 2023-11-24 DIAGNOSIS — I10 Essential (primary) hypertension: Secondary | ICD-10-CM | POA: Diagnosis not present

## 2023-11-24 DIAGNOSIS — K439 Ventral hernia without obstruction or gangrene: Secondary | ICD-10-CM | POA: Diagnosis not present

## 2023-11-24 DIAGNOSIS — M545 Low back pain, unspecified: Secondary | ICD-10-CM | POA: Diagnosis not present

## 2024-02-02 DIAGNOSIS — Z79899 Other long term (current) drug therapy: Secondary | ICD-10-CM | POA: Diagnosis not present

## 2024-02-02 DIAGNOSIS — E0500 Thyrotoxicosis with diffuse goiter without thyrotoxic crisis or storm: Secondary | ICD-10-CM | POA: Diagnosis not present

## 2024-03-05 DIAGNOSIS — I700 Atherosclerosis of aorta: Secondary | ICD-10-CM | POA: Diagnosis not present

## 2024-03-05 DIAGNOSIS — M545 Low back pain, unspecified: Secondary | ICD-10-CM | POA: Diagnosis not present

## 2024-03-05 DIAGNOSIS — E785 Hyperlipidemia, unspecified: Secondary | ICD-10-CM | POA: Diagnosis not present

## 2024-03-05 DIAGNOSIS — K439 Ventral hernia without obstruction or gangrene: Secondary | ICD-10-CM | POA: Diagnosis not present

## 2024-03-05 DIAGNOSIS — J309 Allergic rhinitis, unspecified: Secondary | ICD-10-CM | POA: Diagnosis not present

## 2024-03-05 DIAGNOSIS — E1140 Type 2 diabetes mellitus with diabetic neuropathy, unspecified: Secondary | ICD-10-CM | POA: Diagnosis not present

## 2024-03-05 DIAGNOSIS — I739 Peripheral vascular disease, unspecified: Secondary | ICD-10-CM | POA: Diagnosis not present

## 2024-03-05 DIAGNOSIS — G479 Sleep disorder, unspecified: Secondary | ICD-10-CM | POA: Diagnosis not present

## 2024-03-05 DIAGNOSIS — I10 Essential (primary) hypertension: Secondary | ICD-10-CM | POA: Diagnosis not present

## 2024-04-04 DIAGNOSIS — I10 Essential (primary) hypertension: Secondary | ICD-10-CM | POA: Diagnosis not present

## 2024-04-19 DIAGNOSIS — E1140 Type 2 diabetes mellitus with diabetic neuropathy, unspecified: Secondary | ICD-10-CM | POA: Diagnosis not present

## 2024-04-19 DIAGNOSIS — I10 Essential (primary) hypertension: Secondary | ICD-10-CM | POA: Diagnosis not present

## 2024-04-19 DIAGNOSIS — J309 Allergic rhinitis, unspecified: Secondary | ICD-10-CM | POA: Diagnosis not present

## 2024-07-11 ENCOUNTER — Ambulatory Visit: Admitting: Podiatry

## 2024-07-11 DIAGNOSIS — D2372 Other benign neoplasm of skin of left lower limb, including hip: Secondary | ICD-10-CM | POA: Diagnosis not present

## 2024-07-11 DIAGNOSIS — D2371 Other benign neoplasm of skin of right lower limb, including hip: Secondary | ICD-10-CM | POA: Diagnosis not present

## 2024-07-11 DIAGNOSIS — M21962 Unspecified acquired deformity of left lower leg: Secondary | ICD-10-CM

## 2024-07-11 DIAGNOSIS — M21961 Unspecified acquired deformity of right lower leg: Secondary | ICD-10-CM | POA: Diagnosis not present

## 2024-07-11 NOTE — Progress Notes (Signed)
 Chief Complaint  Patient presents with   Callouses    Bilateral callus/corn at 5th Met.  Has been trying to grind down but they just keep coming back  Pre diabetic. Plavix .    Discussed the use of AI scribe software for clinical note transcription with the patient, who gave verbal consent to proceed.  History of Present Illness Calvin Wilkerson is a 58 year old male who presents with painful corns on his feet.  He has painful corns on both feet, with the right foot affected for approximately seven to eight years and the left foot for four to five years. He describes the corns as causing significant pain, especially when pressure is applied. He has attempted to manage the condition by grinding down the corns and trying to remove the core, but this has not provided lasting relief. No drainage from the corns has been noted.  He has tried changing shoes multiple times but struggles to find a pair wide enough to accommodate his feet comfortably. His feet tend to flatten and spread when he stands, exacerbating the issue. He has not been able to wear new shoes comfortably and currently wears shoes with holes in the tips. He has also tried using corn pads but left one on too long, resulting in a hole in his foot that has since been filling with dead skin.  He recalls that the corns began after he stopped wearing steel-toed shoes, which he thought would alleviate the problem. Despite not wearing steel-toed shoes for five years, the corns have persisted. He has been using tennis shoes and flip-flops, but these have not provided relief due to their inadequate width and support.  He has had a high arch since childhood. His feet have become larger, possibly due to water retention, making it difficult to find shoes that fit properly.  He is disabled due to stomach issues and other health problems, which has limited his physical activity over the past five years. His wife is also disabled with dementia, which  restricts his ability to leave the house. He no longer engages in activities like gardening, which he used to do regularly.   Past Medical History:  Diagnosis Date   Abnormal stress test showing ischemia involving inferior wall 02/19/2019   Angina pectoris 03/09/2019   Aortic atherosclerosis    CAD (coronary artery disease) 07/06/2019   Cellulitis of right knee 07/12/2016   Cigarette smoker 01/15/2019   COPD (chronic obstructive pulmonary disease) (HCC)    Crohn's disease (HCC)    DOE (dyspnea on exertion) 01/15/2019   Erectile dysfunction    GERD (gastroesophageal reflux disease)    GI bleed    History of diverticulitis    History of recurrent TIAs    HLD (hyperlipidemia) 12/24/2016   HTN (hypertension), benign 12/24/2016   Hypertension    Hyperthyroidism    Incisional hernia without obstruction or gangrene 12/31/2016   Mood disorder    PVD (peripheral vascular disease)    S/P hernia repair 12/16/2016   Added automatically from request for surgery [id number]  Formatting of this note might be different from the original. Added automatically from request for surgery [id number]   Status post laparoscopy 12/2016   removed mesh from previous hernia repair that was causing a strangulated small intestine.   TIA (transient ischemic attack) 09/05/2015   Vitamin D deficiency    Past Surgical History:  Procedure Laterality Date   APPENDECTOMY     COLECTOMY     COLONOSCOPY  01/2016  Colonic polyp status post polypectomy. Few erosions in the TI consistent with history of Crohns Disease. Mild pancolonic diverticulosis. Internal hemorrhoids.    COLOSTOMY REVERSAL     CORONARY STENT INTERVENTION N/A 03/16/2019   Procedure: CORONARY STENT INTERVENTION;  Surgeon: Wonda Sharper, MD;  Location: [facility] INVASIVE CV LAB;  Service: Cardiovascular;  Laterality: N/A;   [place]     HERNIA REPAIR     LEFT HEART CATH AND CORONARY ANGIOGRAPHY N/A 03/16/2019   Procedure: LEFT HEART CATH AND CORONARY ANGIOGRAPHY;   Surgeon: Wonda Sharper, MD;  Location: [facility] INVASIVE CV LAB;  Service: Cardiovascular;  Laterality: N/A;   UPPER GI ENDOSCOPY  2011   Healed esophageal erosions. Mild gastritis.    WISDOM TOOTH EXTRACTION     No Known Allergies  Physical Exam EXTREMITIES: Palpable pedal pulses bilateral.  No open lesions are noted.  There are focal hyperkeratotic lesions submet 5 bilateral with pain on palpation.  No surrounding erythema is noted.  Epicritic sensation is intact.  The bilateral first metatarsal is plantarflexed.  This is semiflexible in nature.  This is causing forefoot cavus deformity which is transferring the weight over to the fifth metatarsal head during gait, after the first metatarsal strikes the ground and shifts the foot laterally.   Assessment/Plan of Care: 1. Benign neoplasm of skin of left foot   2. Benign neoplasm of skin of right foot   3. Metatarsal deformity, left   4. Metatarsal deformity, right     Assessment & Plan Painful benign skin lesion bilateral plantar forefoot Chronic corns and callosities on the feet, with the right corn present for 7-8 years and the left for 4-5 years. The corns are painful, especially when driving, and are exacerbated by pressure from shoes. The anatomical structure and weight distribution contribute to recurrence. Previous self-treatment with grinding and corn pads has been ineffective. The corns are not draining and are described as hard pits. The condition is likely due to anatomical factors such as high arches and bone prominence, leading to pressure points on the feet.  His plantarflexed first metatarsal is causing increased pressure at the fifth metatarsal head bilateral, leading to painful hyperkeratotic skin lesions. - Applied acid treatment to soften the corns and discourage recurrence.  The lesions were shaved with a sterile #313 blade.  Following shaving of the lesions, they were treated with Cantharone solution for 1 application.  Band-Aids  were applied and he will remove the Band-Aids in 4 to 6 hours.  He may expect pain/blistering tomorrow - Advised use of a felt donut pad to offload pressure from the corns. - Recommended trying shoes with removable insoles to adjust pressure distribution.  We can adjust these inserts at his next visit if he brings them with him. - Suggested purchasing shoes from the [employer] brand for better toe box accommodation. - Scheduled follow-up appointment in 4-6 weeks to reassess and consider a second application of treatment.   Awanda CHARM Imperial, DPM, FACFAS [facility]     [address] [place], [place] [address]                Office [phone]  Fax [phone]

## 2024-08-24 ENCOUNTER — Ambulatory Visit: Admitting: Podiatry

## 2024-08-24 DIAGNOSIS — L84 Corns and callosities: Secondary | ICD-10-CM

## 2024-08-24 DIAGNOSIS — M21962 Unspecified acquired deformity of left lower leg: Secondary | ICD-10-CM | POA: Diagnosis not present

## 2024-08-24 DIAGNOSIS — M21961 Unspecified acquired deformity of right lower leg: Secondary | ICD-10-CM

## 2024-08-24 NOTE — Progress Notes (Signed)
 "   Chief Complaint  Patient presents with   Wound Check    Bilateral lesion, lateral sides, both are callused and closed today. Painful with pressure. Wears sandals around the house so he is not barefoot. Feels like there is a core in it. Was told that if he sands it downs long enough it would go away.  Plavix , unsure about DM, very borderline.    HPI: 59 y.o. male presents today for follow-up of benign skin lesions bilateral submet 5.  He had Cantharone applied to the lesions last visit.  He states that they were quite sore for few days after the last treatment.  He does not seem eager to have another treatment performed today.  He states that the calluses are back and painful again today.  Past Medical History:  Diagnosis Date   Abnormal stress test showing ischemia involving inferior wall 02/19/2019   Angina pectoris 03/09/2019   Aortic atherosclerosis    CAD (coronary artery disease) 07/06/2019   Cellulitis of right knee 07/12/2016   Cigarette smoker 01/15/2019   COPD (chronic obstructive pulmonary disease) (HCC)    Crohn's disease (HCC)    DOE (dyspnea on exertion) 01/15/2019   Erectile dysfunction    GERD (gastroesophageal reflux disease)    GI bleed    History of diverticulitis    History of recurrent TIAs    HLD (hyperlipidemia) 12/24/2016   HTN (hypertension), benign 12/24/2016   Hypertension    Hyperthyroidism    Incisional hernia without obstruction or gangrene 12/31/2016   Mood disorder    PVD (peripheral vascular disease)    S/P hernia repair 12/16/2016   Added automatically from request for surgery [id number]  Formatting of this note might be different from the original. Added automatically from request for surgery [id number]   Status post laparoscopy 12/2016   removed mesh from previous hernia repair that was causing a strangulated small intestine.   TIA (transient ischemic attack) 09/05/2015   Vitamin D deficiency    Past Surgical History:  Procedure Laterality Date   APPENDECTOMY      COLECTOMY     COLONOSCOPY  01/2016   Colonic polyp status post polypectomy. Few erosions in the TI consistent with history of Crohns Disease. Mild pancolonic diverticulosis. Internal hemorrhoids.    COLOSTOMY REVERSAL     CORONARY STENT INTERVENTION N/A 03/16/2019   Procedure: CORONARY STENT INTERVENTION;  Surgeon: Wonda Sharper, MD;  Location: [facility] INVASIVE CV LAB;  Service: Cardiovascular;  Laterality: N/A;   [place]     HERNIA REPAIR     LEFT HEART CATH AND CORONARY ANGIOGRAPHY N/A 03/16/2019   Procedure: LEFT HEART CATH AND CORONARY ANGIOGRAPHY;  Surgeon: Wonda Sharper, MD;  Location: [facility] INVASIVE CV LAB;  Service: Cardiovascular;  Laterality: N/A;   UPPER GI ENDOSCOPY  2011   Healed esophageal erosions. Mild gastritis.    WISDOM TOOTH EXTRACTION     Allergies[1]   Physical Exam: Palpable pedal pulses.  No open lesions are noted.  There is a plantarflexed fifth metatarsal bilateral.  Assessment/Plan of Care: 1. Metatarsal deformity, left   2. Metatarsal deformity, right   3. Callus of foot     The hyperkeratotic lesions were shaved with a sterile #[id number] blade today.  Salinocaine was applied.  His insoles were removed from his shoes and felt offloading pads were placed underneath his insoles.  If this provides comfort, he was given a referral for [employer] to have custom molded orthotics made with fifth met head offloading/accommodation.  Follow-up  as needed.  If he continues to have pain from these recurring lesions, the next option would be surgical correction including elevational osteotomy of the fifth metatarsal heads.   Awanda CHARM Imperial, DPM, FACFAS [facility]     [address] [place], [place] [address]                Office [phone]  Fax [phone]    [1] No Known Allergies  "

## 2024-10-03 ENCOUNTER — Ambulatory Visit: Payer: Self-pay | Admitting: Cardiology
